# Patient Record
Sex: Male | Born: 1945 | Race: White | Hispanic: No | Marital: Married | Smoking: Never smoker
Health system: Southern US, Community
[De-identification: ages and names within clinical notes are randomized; demographics above are authoritative.]

## PROBLEM LIST (undated history)

## (undated) DIAGNOSIS — E781 Pure hyperglyceridemia: Secondary | ICD-10-CM

## (undated) DIAGNOSIS — Z87448 Personal history of other diseases of urinary system: Secondary | ICD-10-CM

## (undated) DIAGNOSIS — K219 Gastro-esophageal reflux disease without esophagitis: Secondary | ICD-10-CM

## (undated) DIAGNOSIS — K449 Diaphragmatic hernia without obstruction or gangrene: Secondary | ICD-10-CM

## (undated) DIAGNOSIS — E119 Type 2 diabetes mellitus without complications: Secondary | ICD-10-CM

## (undated) DIAGNOSIS — Z85828 Personal history of other malignant neoplasm of skin: Secondary | ICD-10-CM

## (undated) DIAGNOSIS — M1990 Unspecified osteoarthritis, unspecified site: Secondary | ICD-10-CM

## (undated) HISTORY — DX: Unspecified osteoarthritis, unspecified site: M19.90

## (undated) HISTORY — DX: Gastro-esophageal reflux disease without esophagitis: K21.9

## (undated) HISTORY — DX: Type 2 diabetes mellitus without complications: E11.9

## (undated) HISTORY — DX: Personal history of other malignant neoplasm of skin: Z85.828

## (undated) HISTORY — DX: Personal history of other diseases of urinary system: Z87.448

## (undated) HISTORY — DX: Pure hyperglyceridemia: E78.1

## (undated) HISTORY — DX: Diaphragmatic hernia without obstruction or gangrene: K44.9

---

## 1999-11-25 ENCOUNTER — Emergency Department (HOSPITAL_COMMUNITY): Admission: EM | Admit: 1999-11-25 | Discharge: 1999-11-26 | Payer: Self-pay | Admitting: *Deleted

## 2000-04-02 ENCOUNTER — Ambulatory Visit (HOSPITAL_COMMUNITY): Admission: RE | Admit: 2000-04-02 | Discharge: 2000-04-02 | Payer: Self-pay | Admitting: *Deleted

## 2000-04-02 ENCOUNTER — Encounter: Payer: Self-pay | Admitting: *Deleted

## 2004-04-16 HISTORY — PX: OTHER SURGICAL HISTORY: SHX169

## 2004-05-29 ENCOUNTER — Ambulatory Visit (HOSPITAL_BASED_OUTPATIENT_CLINIC_OR_DEPARTMENT_OTHER): Admission: RE | Admit: 2004-05-29 | Discharge: 2004-05-29 | Payer: Self-pay | Admitting: Urology

## 2005-06-10 ENCOUNTER — Ambulatory Visit: Payer: Self-pay | Admitting: Pulmonary Disease

## 2005-07-03 ENCOUNTER — Ambulatory Visit: Payer: Self-pay | Admitting: Pulmonary Disease

## 2005-09-11 ENCOUNTER — Ambulatory Visit: Payer: Self-pay | Admitting: Gastroenterology

## 2005-09-25 ENCOUNTER — Ambulatory Visit: Payer: Self-pay | Admitting: Gastroenterology

## 2007-01-04 ENCOUNTER — Ambulatory Visit: Payer: Self-pay | Admitting: Pulmonary Disease

## 2007-01-13 ENCOUNTER — Ambulatory Visit: Payer: Self-pay | Admitting: Pulmonary Disease

## 2007-01-13 LAB — CONVERTED CEMR LAB
ALT: 18 units/L (ref 0–53)
AST: 20 units/L (ref 0–37)
Albumin: 4.1 g/dL (ref 3.5–5.2)
Alkaline Phosphatase: 66 units/L (ref 39–117)
BUN: 16 mg/dL (ref 6–23)
Basophils Absolute: 0.1 10*3/uL (ref 0.0–0.1)
Basophils Relative: 0.8 % (ref 0.0–1.0)
Bilirubin Urine: NEGATIVE
Bilirubin, Direct: 0.1 mg/dL (ref 0.0–0.3)
CO2: 32 meq/L (ref 19–32)
Calcium: 10.2 mg/dL (ref 8.4–10.5)
Chloride: 110 meq/L (ref 96–112)
Cholesterol: 187 mg/dL (ref 0–200)
Creatinine, Ser: 1.1 mg/dL (ref 0.4–1.5)
Direct LDL: 87.8 mg/dL
Eosinophils Absolute: 0.1 10*3/uL (ref 0.0–0.6)
Eosinophils Relative: 1.6 % (ref 0.0–5.0)
GFR calc Af Amer: 88 mL/min
GFR calc non Af Amer: 73 mL/min
Glucose, Bld: 92 mg/dL (ref 70–99)
HCT: 43 % (ref 39.0–52.0)
HDL: 33.2 mg/dL — ABNORMAL LOW (ref >39.0)
Hemoglobin: 15.1 g/dL (ref 13.0–17.0)
Ketones, ur: NEGATIVE mg/dL
Leukocytes, UA: NEGATIVE
Lymphocytes Relative: 17.1 % (ref 12.0–46.0)
MCHC: 35 g/dL (ref 30.0–36.0)
MCV: 86.8 fL (ref 78.0–100.0)
Monocytes Absolute: 0.5 10*3/uL (ref 0.2–0.7)
Monocytes Relative: 6.2 % (ref 3.0–11.0)
Neutro Abs: 6.3 10*3/uL (ref 1.4–7.7)
Neutrophils Relative %: 74.3 % (ref 43.0–77.0)
Nitrite: NEGATIVE
PSA: 0.9 ng/mL (ref 0.10–4.00)
Platelets: 165 10*3/uL (ref 150–400)
Potassium: 4.5 meq/L (ref 3.5–5.1)
RBC: 4.95 M/uL (ref 4.22–5.81)
RDW: 11.6 % (ref 11.5–14.6)
Sodium: 147 meq/L — ABNORMAL HIGH (ref 135–145)
Specific Gravity, Urine: 1.025 (ref 1.000–1.03)
TSH: 1.05 microintl units/mL (ref 0.35–5.50)
Total Bilirubin: 1 mg/dL (ref 0.3–1.2)
Total CHOL/HDL Ratio: 5.6
Total Protein, Urine: NEGATIVE mg/dL
Total Protein: 7.2 g/dL (ref 6.0–8.3)
Triglycerides: 221 mg/dL (ref 0–149)
Urine Glucose: NEGATIVE mg/dL
Urobilinogen, UA: 0.2 (ref 0.0–1.0)
VLDL: 44 mg/dL — ABNORMAL HIGH (ref 0–40)
WBC: 8.4 10*3/uL (ref 4.5–10.5)
pH: 6 (ref 5.0–8.0)

## 2008-02-16 DIAGNOSIS — K219 Gastro-esophageal reflux disease without esophagitis: Secondary | ICD-10-CM

## 2008-02-16 DIAGNOSIS — K449 Diaphragmatic hernia without obstruction or gangrene: Secondary | ICD-10-CM | POA: Insufficient documentation

## 2008-02-16 DIAGNOSIS — K5730 Diverticulosis of large intestine without perforation or abscess without bleeding: Secondary | ICD-10-CM | POA: Insufficient documentation

## 2008-02-17 ENCOUNTER — Ambulatory Visit: Payer: Self-pay | Admitting: Pulmonary Disease

## 2008-02-19 DIAGNOSIS — N35919 Unspecified urethral stricture, male, unspecified site: Secondary | ICD-10-CM | POA: Insufficient documentation

## 2008-02-19 DIAGNOSIS — R0789 Other chest pain: Secondary | ICD-10-CM | POA: Insufficient documentation

## 2008-02-19 DIAGNOSIS — E782 Mixed hyperlipidemia: Secondary | ICD-10-CM | POA: Insufficient documentation

## 2008-02-19 DIAGNOSIS — E785 Hyperlipidemia, unspecified: Secondary | ICD-10-CM | POA: Insufficient documentation

## 2008-02-19 DIAGNOSIS — M1990 Unspecified osteoarthritis, unspecified site: Secondary | ICD-10-CM | POA: Insufficient documentation

## 2008-02-19 DIAGNOSIS — Z85828 Personal history of other malignant neoplasm of skin: Secondary | ICD-10-CM

## 2008-03-10 ENCOUNTER — Telehealth: Payer: Self-pay | Admitting: Pulmonary Disease

## 2008-04-03 ENCOUNTER — Telehealth (INDEPENDENT_AMBULATORY_CARE_PROVIDER_SITE_OTHER): Payer: Self-pay | Admitting: *Deleted

## 2008-06-20 ENCOUNTER — Ambulatory Visit: Payer: Self-pay | Admitting: Pulmonary Disease

## 2009-04-19 ENCOUNTER — Ambulatory Visit: Payer: Self-pay | Admitting: Pulmonary Disease

## 2010-07-14 LAB — CONVERTED CEMR LAB
ALT: 20 units/L (ref 0–53)
AST: 19 units/L (ref 0–37)
Albumin: 4.5 g/dL (ref 3.5–5.2)
Alkaline Phosphatase: 64 units/L (ref 39–117)
BUN: 16 mg/dL (ref 6–23)
Basophils Absolute: 0.1 10*3/uL (ref 0.0–0.1)
Basophils Relative: 0.9 % (ref 0.0–3.0)
Bilirubin, Direct: 0.1 mg/dL (ref 0.0–0.3)
CO2: 30 meq/L (ref 19–32)
Calcium: 9.5 mg/dL (ref 8.4–10.5)
Chloride: 104 meq/L (ref 96–112)
Cholesterol: 202 mg/dL (ref 0–200)
Creatinine, Ser: 1 mg/dL (ref 0.4–1.5)
Direct LDL: 120.8 mg/dL
Eosinophils Absolute: 0.1 10*3/uL (ref 0.0–0.7)
Eosinophils Relative: 1 % (ref 0.0–5.0)
GFR calc Af Amer: 98 mL/min
GFR calc non Af Amer: 81 mL/min
Glucose, Bld: 88 mg/dL (ref 70–99)
HCT: 43.4 % (ref 39.0–52.0)
HDL: 35.8 mg/dL — ABNORMAL LOW (ref >39.0)
Hemoglobin: 15.2 g/dL (ref 13.0–17.0)
Lymphocytes Relative: 17.8 % (ref 12.0–46.0)
MCHC: 34.9 g/dL (ref 30.0–36.0)
MCV: 89.1 fL (ref 78.0–100.0)
Monocytes Absolute: 0.4 10*3/uL (ref 0.1–1.0)
Monocytes Relative: 4.8 % (ref 3.0–12.0)
Neutro Abs: 6.6 10*3/uL (ref 1.4–7.7)
Neutrophils Relative %: 75.5 % (ref 43.0–77.0)
PSA: 0.84 ng/mL (ref 0.10–4.00)
Platelets: 189 10*3/uL (ref 150–400)
Potassium: 4.2 meq/L (ref 3.5–5.1)
RBC: 4.87 M/uL (ref 4.22–5.81)
RDW: 12.1 % (ref 11.5–14.6)
Sodium: 141 meq/L (ref 135–145)
TSH: 1.04 microintl units/mL (ref 0.35–5.50)
Total Bilirubin: 1.3 mg/dL — ABNORMAL HIGH (ref 0.3–1.2)
Total CHOL/HDL Ratio: 5.6
Total Protein: 7.8 g/dL (ref 6.0–8.3)
Triglycerides: 136 mg/dL (ref 0–149)
VLDL: 27 mg/dL (ref 0–40)
WBC: 8.7 10*3/uL (ref 4.5–10.5)

## 2010-10-29 NOTE — Assessment & Plan Note (Signed)
Jeffrey Dodson                             PULMONARY OFFICE NOTE   Jeffrey Dodson, Jeffrey Dodson                     MRN:          [id number]  DATE:01/04/2007                            DOB:          01-23-1946    HISTORY OF PRESENT ILLNESS:  Patient is a 65 year old white male patient  of Dr. Jodelle Green who has a known history of gastroesophageal reflux.  The  patient has not been seen in the office in greater than a year and a  half.  Reports that he has been doing well and is only on daily  medicines of Prilosec and aspirin.  Patient complains that over the last  3 days he has had an episode in which he feels lightheaded when he turns  his head, stands up from a sitting position, or rolls over in the bed.  Patient did take his blood pressure over the weekend and it was at  155/88.  This was taken right after patient had worked outside for over  an hour in extreme temperatures.  Patient denies any visual or speech  changes, chest pain, palpitations, difficulty swallowing, abdominal  pain, nausea, vomiting.   PAST MEDICAL HISTORY:  1. Gastroesophageal reflux.  2. Diverticulosis with colonoscopy in April 2007 by Dr. Corinda Gubler.   CURRENT MEDICATIONS:  1. Prilosec 20 mg daily.  2. Aspirin 81 mg daily.   DRUG ALLERGIES:  NO KNOWN DRUG ALLERGIES.   PHYSICAL EXAMINATION:  Patient is a pleasant male in no acute distress.  He is afebrile, blood pressure is 124/82, O2 saturation is 99% on room  air, weight is at 187.  HEENT:  PERRLA.  EOMI without nystagmus.  Nasal mucosa is pale.  Posterior pharynx is clear.  Nontender sinuses.  TMs are normal.  NECK:  Supple without cervical adenopathy.  No JVD.  Carotids are equal  with positive upstrokes.  No bruits noted.  LUNGS:  Sounds are clear to auscultation bilaterally.  CARDIAC:  S1-S2 without murmur, rub, or gallop.  ABDOMEN:  Soft and nontender, no palpable hepatosplenomegaly.  EXTREMITIES:  Warm without any calf  tenderness, cyanosis, clubbing, or  edema.  NEURO:  Alert and oriented x3.  Cranial nerves II-XII are intact.  Equal  strength of the upper and lower extremities.  Normal hand grips.  Normal  gait.  Negative Romberg.  Head maneuvers without reproducible symptoms  and negative nystagmus.   IMPRESSION AND PLAN:  1. Dizziness, suspect underlying vertigo.  Patient is recommended to      change positions slowly.  May use meclizine 25 mg a half to a whole      tablet up to 3 times a day as needed.  Patient is to return back      here in 2 weeks as scheduled for his complete physical exam or      sooner if needed.  Patient is advised if symptoms do not improve or      worsen he is to contact the office for sooner followup.  2. Elevated blood pressure readings.  I would recommend that patient      check  blood pressures at rest each day and bring in a blood      pressure log into his next office visit.  He is to avoid over the      counter products with decongestants.  Patient will follow back up      as scheduled or sooner if needed.      Rubye Oaks, NP  Electronically Signed      Lonzo Cloud. Kriste Basque, MD  Electronically Signed   TP/MedQ  DD: 01/05/2007  DT: 01/05/2007  Job #: [id number]

## 2010-11-01 NOTE — Op Note (Signed)
NAME:  Jeffrey Dodson, Jeffrey Dodson              ACCOUNT NO.:  [id number]   MEDICAL RECORD NO.:  [id number]          PATIENT TYPE:  AMB   LOCATION:  [facility]                         FACILITY:  [facility]   PHYSICIAN:  Ronald L. Ovidio Hanger, M.D.DATE OF BIRTH:  Mar 30, 1946   DATE OF PROCEDURE:  05/29/2004  DATE OF DISCHARGE:                                 OPERATIVE REPORT   PREOPERATIVE DIAGNOSIS:  Urethral stricture disease.   OPERATION/PROCEDURE:  1.  Cystourethroscopy.  2.  Optical urethrotomy.   SURGEON:  Lucrezia Starch. Earlene Plater, M.D.   ANESTHESIA:  LMA.   ESTIMATED BLOOD LOSS:  Negligible.   DRAINS:  20-French coude Foley catheter.   COMPLICATIONS:  None.   INDICATIONS FOR PROCEDURE:  [name] is a very nice 65 year old white  male who presented with pelvic pain, hesitant voiding.  He underwent a CT  scan of the pelvis which was normal but on cystourethroscopy, was found to  have a significant urethral stricture in the deep bulbar area with  essentially pinhole.  He understands the risks, benefits and alternatives.  He elected to proceed with the above procedure.   DESCRIPTION OF PROCEDURE:  The patient was placed in the supine position.  After the proper LMA anesthesia, was placed in the dorsal lithotomy  position, prepped and draped with Betadine in the sterile fashion.  Cystourethroscopy was performed after the meatus had been dilated to 30-  [place] with [employer] sounds by utilizing the optical urethrotome and the 0-  degree lens.  The stricture was identified and a 3-French open-ended  catheter was placed through it.  It was deep bulbar and was approximately 1  cm in length.  It was somewhat filamentous but very tight.  Utilizing the  optical urethrotome in the 12 o'clock position, it was incised to normal  vascularized tissue and widely opened distal to the sphincter.  Cystourethroscopy was then performed.  The bladder really was smooth-walled,  effluxing clear urine through normally-placed  ureteral orifices bilaterally.  Both 12-degree and 70-degree lenses were utilized to look at the bladder.  There was moderate trilobar hypertrophy and again reinspection revealed that  the urethral stricture was widely patent.  After the urethrotome had been  visually removed, a 20-French, 10 mL balloon coude catheter was passed.  The  bladder was draining clear.  It was maintained and the patient was taken to  the recovery room stable.     Rona  RLD/MEDQ  D:  05/29/2004  T:  05/29/2004  Job:  [id number]

## 2010-11-01 NOTE — Cardiovascular Report (Signed)
[facility]  Patient:    HANI, CAMPUSANO                     MRN: [id number] Proc. Date: 04/02/00 Adm. Date:  [id number] Attending:  Darlin Priestly CC:         Kerry Kass, M.D. [facility]  Cardiac Cath Lab   Cardiac Catheterization  PROCEDURES: 1. Left heart catheterization. 2. Coronary angiography. 3. Left ventriculogram.  ATTENDING FOR THE CASE:  Lenise Herald, M.D.  COMPLICATIONS:  None.  INDICATIONS:  Mr. Katich is a 65 year old white male patient of Dr. Jethro Bolus, with history of intermittent chest pain for several months.  The patient has undergone a recent stress Cardiolite which revealed evidence of inferior wall scar with no evidence of reversible ischemia; however, he continued to have chest pain and has elected to proceed with cardiac catheterization to definitely define his coronary anatomy.  DESCRIPTION OF PROCEDURE:  After obtaining full and informed consent, the patient was brought to the cardiac catheterization lab where his right and left groins were shaved, prepped, and draped in the usual sterile fashion. ECG monitoring was established.  Using modified Seldinger technique, #6 French arterial sheath inserted in the right femoral artery.  The 6 French diagnostic catheter was then used to perform diagnostic angiography.  This reveals a large left vein with no significant disease.  The LAD is a large vessel which coursed the apex and gave rise to diagonal branches.  The LAD has no significant disease.  The first and second diagonal branches were medium sized vessels with no significant disease.  Left circumflex was a large vessel which coursed in the A-V groove and gave rise to three obtuse marginals.  The A-V groove and circumflex had no significant disease.  The first OMs and small vessel had no significant disease.  The second OM was a large vessel which bifurcates in its mid portion and has no significant  disease.  The third OM is a small vessel with no significant disease.  The right coronary artery was a large vessel, which was dominant ______both PDA, as well as  posterolateral branch.  There was no significant disease the RCA, PDA, or posterolateral branch .  LEFT VENTRICULOGRAM:  The left ventriculogram revealed a preserved EF 50%.  HEMODYNAMICS:  Systemic arterial pressure 122/76, LV systemic pressure 122/10, LVEDP of 16.  The right femoral site was then closed using a Perclose device without complications.  Ancef 1 gram was given prophylactically.  CONCLUSIONS: 1. No significant coronary artery disease. 2. Normal left ventricular systolic function. DD:  04/02/00 TD:  04/02/00 Job: [id number]

## 2013-12-12 ENCOUNTER — Encounter: Payer: Self-pay | Admitting: *Deleted

## 2013-12-12 ENCOUNTER — Ambulatory Visit (INDEPENDENT_AMBULATORY_CARE_PROVIDER_SITE_OTHER): Payer: Medicare Other | Admitting: Physician Assistant

## 2013-12-12 VITALS — BP 125/71 | HR 52 | Temp 98.1°F | Resp 16 | Ht 70.0 in | Wt 190.5 lb

## 2013-12-12 DIAGNOSIS — R079 Chest pain, unspecified: Secondary | ICD-10-CM

## 2013-12-12 DIAGNOSIS — R0789 Other chest pain: Secondary | ICD-10-CM

## 2013-12-12 NOTE — Patient Instructions (Signed)
Take daily 81 mg Aspirin and Zantac daily.  Watch your salt intake.  You will be contacted by Cardiology for further evaluation to rule out a cardiac cause of your symptoms.  I do believe acid reflux is playing a part in your symptoms.  Please take Zantac daily. Avoid trigger foods and late-night eatings.  Water is better than sodas. Follow-up with me in 1-2 weeks. I recommend we do you complete physical at that time.  If chest pain recurs before workup is complete, please return to the ER.

## 2013-12-12 NOTE — Progress Notes (Signed)
Patient presents to clinic today to establish care.  Patient was admitted for overnight observation in a Hospital in [place] recently due to complaints of chest pain.  Patient has workup including CXR, troponin, EKG and Myoview study.  Labs and CXR unremarkable.  Myoview and EKG are unavailable at time of visit.  Patient endorses they were normal.    Patient describes chest pain as follows: burning in nature with some associated chest tightness.  Denies anxiety or palpitations.  Endorses that symptoms started after a week at the beach.  Endorses eating poorly. Denies recurrence of symptoms.   Past Medical History  Diagnosis Date  . Other chest pain   . Hypercholesterolemia   . Hiatal hernia   . GERD (gastroesophageal reflux disease)   . Diverticulitis of colon   . Hx of urethral stricture   . Degenerative joint disease   . Personal history of skin cancer     Past Surgical History  Procedure Laterality Date  . S/p urethrotomy  11/05    Dr Rosana Hoes    No current outpatient prescriptions on file prior to visit.   No current facility-administered medications on file prior to visit.    No Known Allergies  Family History  Problem Relation Age of Onset  . Deep vein thrombosis Father 73    Deceased  . Other Father     PTE  . Hypertension Mother   . Congestive Heart Failure    . Diabetes Mellitus II    . Cirrhosis Brother     Liver, deceased  . Stroke Sister   . Diabetes Sister   . COPD Sister     History   Social History  . Marital Status: Married    Spouse Name: N/A    Number of Children: N/A  . Years of Education: N/A   Occupational History  . Not on file.   Social History Main Topics  . Smoking status: Never Smoker   . Smokeless tobacco: Not on file  . Alcohol Use: Not on file  . Drug Use: Not on file  . Sexual Activity: Not on file   Other Topics Concern  . Not on file   Social History Narrative   Married to [facility] [employer] [facility]   Non-smoker   No ETOH         ROS See HPI.  All other ROS are negative.  BP 125/71  Pulse 52  Temp(Src) 98.1 F (36.7 C) (Oral)  Resp 16  Ht 5\' 10"  (1.778 m)  Wt 190 lb 8 oz (86.41 kg)  BMI 27.33 kg/m2  SpO2 98%  Physical Exam  Vitals reviewed. Constitutional: He is oriented to person, place, and time and well-developed, well-nourished, and in no distress.  HENT:  Head: Normocephalic and atraumatic.  Eyes: Conjunctivae are normal. Pupils are equal, round, and reactive to light.  Neck: Neck supple. No thyromegaly present.  Cardiovascular: Normal rate, regular rhythm, normal heart sounds and intact distal pulses.   Pulmonary/Chest: Effort normal and breath sounds normal. No respiratory distress. He has no wheezes. He has no rales. He exhibits no tenderness.  Abdominal: Soft. Bowel sounds are normal. He exhibits no distension and no mass. There is no tenderness. There is no rebound and no guarding.  Lymphadenopathy:    He has no cervical adenopathy.  Neurological: He is alert and oriented to person, place, and time.  Skin: Skin is warm and dry. No rash noted.  Psychiatric: Affect normal.   Assessment/Plan:  Atypical chest pain Suspect GI, likely GERD related.  EKG obtained revealing sinus bradycardia..  Will have cardiac workup faxed from hospital.  Will obtain labs today.  Referral placed to Cardiology to further r/o cardiac etiology. Encourage daily aspirin. Rx Ranitidine up to BID.  Avoid late-night eating.  Avoid trigger foods.

## 2013-12-12 NOTE — Progress Notes (Signed)
Pre visit review using our clinic review tool, if applicable. No additional management support is needed unless otherwise documented below in the visit note/SLS  

## 2013-12-13 LAB — COMPREHENSIVE METABOLIC PANEL
ALBUMIN: 4.7 g/dL (ref 3.5–5.2)
ALT: 36 U/L (ref 0–53)
AST: 34 U/L (ref 0–37)
Alkaline Phosphatase: 63 U/L (ref 39–117)
BUN: 17 mg/dL (ref 6–23)
CALCIUM: 9.8 mg/dL (ref 8.4–10.5)
CHLORIDE: 104 meq/L (ref 96–112)
CO2: 27 meq/L (ref 19–32)
Creatinine, Ser: 1.2 mg/dL (ref 0.4–1.5)
GFR: 67.65 mL/min (ref >60.00)
GLUCOSE: 47 mg/dL — AB (ref 70–99)
POTASSIUM: 4.5 meq/L (ref 3.5–5.1)
SODIUM: 141 meq/L (ref 135–145)
TOTAL PROTEIN: 7.9 g/dL (ref 6.0–8.3)
Total Bilirubin: 0.9 mg/dL (ref 0.2–1.2)

## 2013-12-13 LAB — LIPID PANEL
CHOLESTEROL: 176 mg/dL (ref 0–200)
HDL: 41 mg/dL (ref >39.00)
LDL Cholesterol: 90 mg/dL (ref 0–99)
NonHDL: 135
TRIGLYCERIDES: 224 mg/dL — AB (ref 0.0–149.0)
Total CHOL/HDL Ratio: 4
VLDL: 44.8 mg/dL — ABNORMAL HIGH (ref 0.0–40.0)

## 2013-12-14 ENCOUNTER — Encounter (HOSPITAL_COMMUNITY): Payer: Self-pay | Admitting: Emergency Medicine

## 2013-12-14 ENCOUNTER — Encounter: Payer: Self-pay | Admitting: Physician Assistant

## 2013-12-14 ENCOUNTER — Emergency Department (HOSPITAL_COMMUNITY)
Admission: EM | Admit: 2013-12-14 | Discharge: 2013-12-15 | Disposition: A | Discharge status: Home / Self Care | Payer: Medicare Other | Attending: Emergency Medicine | Admitting: Emergency Medicine

## 2013-12-14 ENCOUNTER — Ambulatory Visit (INDEPENDENT_AMBULATORY_CARE_PROVIDER_SITE_OTHER): Payer: Medicare Other | Admitting: Physician Assistant

## 2013-12-14 VITALS — BP 118/84 | HR 45 | Temp 97.8°F | Resp 16 | Ht 70.0 in | Wt 189.2 lb

## 2013-12-14 DIAGNOSIS — Z79899 Other long term (current) drug therapy: Secondary | ICD-10-CM | POA: Insufficient documentation

## 2013-12-14 DIAGNOSIS — R5383 Other fatigue: Secondary | ICD-10-CM

## 2013-12-14 DIAGNOSIS — E7800 Pure hypercholesterolemia, unspecified: Secondary | ICD-10-CM | POA: Insufficient documentation

## 2013-12-14 DIAGNOSIS — R7309 Other abnormal glucose: Secondary | ICD-10-CM

## 2013-12-14 DIAGNOSIS — R5381 Other malaise: Secondary | ICD-10-CM

## 2013-12-14 DIAGNOSIS — K5732 Diverticulitis of large intestine without perforation or abscess without bleeding: Secondary | ICD-10-CM | POA: Insufficient documentation

## 2013-12-14 DIAGNOSIS — K219 Gastro-esophageal reflux disease without esophagitis: Secondary | ICD-10-CM | POA: Insufficient documentation

## 2013-12-14 DIAGNOSIS — R079 Chest pain, unspecified: Secondary | ICD-10-CM | POA: Insufficient documentation

## 2013-12-14 DIAGNOSIS — Z87448 Personal history of other diseases of urinary system: Secondary | ICD-10-CM | POA: Insufficient documentation

## 2013-12-14 DIAGNOSIS — K449 Diaphragmatic hernia without obstruction or gangrene: Secondary | ICD-10-CM | POA: Insufficient documentation

## 2013-12-14 DIAGNOSIS — M1990 Unspecified osteoarthritis, unspecified site: Secondary | ICD-10-CM | POA: Insufficient documentation

## 2013-12-14 DIAGNOSIS — Z7982 Long term (current) use of aspirin: Secondary | ICD-10-CM | POA: Insufficient documentation

## 2013-12-14 DIAGNOSIS — Z85828 Personal history of other malignant neoplasm of skin: Secondary | ICD-10-CM | POA: Insufficient documentation

## 2013-12-14 LAB — BASIC METABOLIC PANEL
BUN: 17 mg/dL (ref 6–23)
CHLORIDE: 106 meq/L (ref 96–112)
CO2: 26 mEq/L (ref 19–32)
CREATININE: 1.18 mg/dL (ref 0.50–1.35)
Calcium: 9.7 mg/dL (ref 8.4–10.5)
Glucose, Bld: 78 mg/dL (ref 70–99)
POTASSIUM: 4.5 meq/L (ref 3.5–5.3)
Sodium: 139 mEq/L (ref 135–145)

## 2013-12-14 LAB — CBC
HEMATOCRIT: 41.5 % (ref 39.0–52.0)
Hemoglobin: 14.8 g/dL (ref 13.0–17.0)
MCH: 30.1 pg (ref 26.0–34.0)
MCHC: 35.7 g/dL (ref 30.0–36.0)
MCV: 84.5 fL (ref 78.0–100.0)
PLATELETS: 165 10*3/uL (ref 150–400)
RBC: 4.91 MIL/uL (ref 4.22–5.81)
RDW: 13.3 % (ref 11.5–15.5)
WBC: 8 10*3/uL (ref 4.0–10.5)

## 2013-12-14 LAB — TSH: TSH: 1.124 u[IU]/mL (ref 0.350–4.500)

## 2013-12-14 LAB — GLUCOSE, POCT (MANUAL RESULT ENTRY): POC GLUCOSE: 75 mg/dL (ref 70–99)

## 2013-12-14 MED ORDER — ASPIRIN 81 MG PO CHEW
162.0000 mg | CHEWABLE_TABLET | Freq: Once | ORAL | Status: AC
  Administered 2013-12-15: 162 mg via ORAL
  Filled 2013-12-14: qty 2

## 2013-12-14 NOTE — Progress Notes (Signed)
Pre visit review using our clinic review tool, if applicable. No additional management support is needed unless otherwise documented below in the visit note/SLS  

## 2013-12-14 NOTE — Assessment & Plan Note (Signed)
Fasting POC glucose at 75.  Will obtain BMP.  Encouraged more frequent meals.  Patient is not getting enough to eat daily.

## 2013-12-14 NOTE — Progress Notes (Signed)
Patient presents to clinic today for blood glucose recheck after a critical glucose level was obtained after last visit.  Glucose level on BMP found to be at 47. Patient was contacted and assessed for symptoms of hypoglycemia.  Patient without symptoms.  In clinic today, patient has been fasting.  Fingerstick glucose at 75.  Patient does wish to have some labs to assess for his generalized fatigue.  Denies chest pain, SOB, palpitations, LH or dizziness.  Denies hx of anemia or vitamin deficiency.  Past Medical History  Diagnosis Date  . Other chest pain   . Hypercholesterolemia   . Hiatal hernia   . GERD (gastroesophageal reflux disease)   . Diverticulitis of colon   . Hx of urethral stricture   . Degenerative joint disease   . Personal history of skin cancer     Current Outpatient Prescriptions on File Prior to Visit  Medication Sig Dispense Refill  . aspirin 81 MG tablet Take 81 mg by mouth daily.      Marland Kitchen ibuprofen (ADVIL,MOTRIN) 200 MG tablet Take 200 mg by mouth daily as needed.      . ranitidine (ZANTAC) 150 MG capsule Take 150 mg by mouth daily as needed for heartburn.       No current facility-administered medications on file prior to visit.    No Known Allergies  Family History  Problem Relation Age of Onset  . Deep vein thrombosis Father 19    Deceased  . Other Father     PTE  . Hypertension Mother   . Congestive Heart Failure    . Diabetes Mellitus II    . Cirrhosis Brother     Liver, deceased  . Stroke Sister   . Diabetes Sister   . COPD Sister     History   Social History  . Marital Status: Married    Spouse Name: N/A    Number of Children: N/A  . Years of Education: N/A   Social History Main Topics  . Smoking status: Never Smoker   . Smokeless tobacco: None  . Alcohol Use: None  . Drug Use: None  . Sexual Activity: None   Other Topics Concern  . None   Social History Narrative   Married to [employer]   Non-smoker   No ETOH          Review of Systems - See HPI.  All other ROS are negative.  BP 118/84  Pulse 45  Temp(Src) 97.8 F (36.6 C) (Oral)  Resp 16  Ht 5\' 10"  (1.778 m)  Wt 189 lb 4 oz (85.843 kg)  BMI 27.15 kg/m2  SpO2 98%  Physical Exam  Constitutional: He is oriented to person, place, and time and well-developed, well-nourished, and in no distress.  HENT:  Head: Normocephalic and atraumatic.  Eyes: Conjunctivae are normal.  Cardiovascular: Regular rhythm, normal heart sounds and intact distal pulses.   No murmur heard. Bradycardic at 52.  Chronic for patient.  Pulmonary/Chest: Effort normal and breath sounds normal.  Neurological: He is alert and oriented to person, place, and time.  Skin: Skin is warm and dry. No rash noted.  Psychiatric: Affect normal.    Recent Results (from the past 2160 hour(s))  COMPREHENSIVE METABOLIC PANEL     Status: Abnormal   Collection Time    12/12/13 11:43 AM      Result Value Ref Range   Sodium 141  135 - 145 mEq/L   Potassium 4.5  3.5 -  5.1 mEq/L   Chloride 104  96 - 112 mEq/L   CO2 27  19 - 32 mEq/L   Glucose, Bld 47 (*) 70 - 99 mg/dL   BUN 17  6 - 23 mg/dL   Creatinine, Ser 1.2  0.4 - 1.5 mg/dL   Total Bilirubin 0.9  0.2 - 1.2 mg/dL   Alkaline Phosphatase 63  39 - 117 U/L   AST 34  0 - 37 U/L   ALT 36  0 - 53 U/L   Total Protein 7.9  6.0 - 8.3 g/dL   Albumin 4.7  3.5 - 5.2 g/dL   Calcium 9.8  8.4 - 10.5 mg/dL   GFR 67.65  >60.00 mL/min  LIPID PANEL     Status: Abnormal   Collection Time    12/12/13 11:43 AM      Result Value Ref Range   Cholesterol 176  0 - 200 mg/dL   Comment: ATP III Classification       Desirable:  < 200 mg/dL               Borderline High:  200 - 239 mg/dL          High:  > = 240 mg/dL   Triglycerides 224.0 (*) 0.0 - 149.0 mg/dL   Comment: Normal:  <150 mg/dLBorderline High:  150 - 199 mg/dL   HDL 41.00  >39.00 mg/dL   VLDL 44.8 (*) 0.0 - 40.0 mg/dL   LDL Cholesterol 90  0 - 99 mg/dL   Total CHOL/HDL Ratio 4      Comment:                Men          Women1/2 Average Risk     3.4          3.3Average Risk          5.0          4.42X Average Risk          9.6          7.13X Average Risk          15.0          11.0                       NonHDL 135.00    GLUCOSE, POCT (MANUAL RESULT ENTRY)     Status: None   Collection Time    12/14/13 11:41 AM      Result Value Ref Range   POC Glucose 75  70 - 99 mg/dl   Comment: Fasting    Assessment/Plan: Other fatigue Will begin by checking CBC, TSH, Vitamin D level.  Recent BMP revealed normal electrolytes.  Abnormal blood sugar Fasting POC glucose at 75.  Will obtain BMP.  Encouraged more frequent meals.  Patient is not getting enough to eat daily.

## 2013-12-14 NOTE — ED Notes (Signed)
PT placed in gown and in bed. PT monitored by 5-lead, bp cuff, and pulse ox.

## 2013-12-14 NOTE — Assessment & Plan Note (Signed)
Will begin by checking CBC, TSH, Vitamin D level.  Recent BMP revealed normal electrolytes.

## 2013-12-14 NOTE — ED Notes (Signed)
Patient with chest pain that started last week.  Patient states that the pain stopped, but started again this evening at 2130. Patient states that he has numbness across his shoulder.  Patient states he does have some shortness of breath, no nausea or vomiting.  Patient is CAOx3 in triage.

## 2013-12-14 NOTE — Patient Instructions (Signed)
Please obtain labs.  I will call you with your results.  Please start taking a daily krill oil supplement.  Increase intake of fish and lean protein.  Make sure you are eating something every 3 hours.  Stay well hydrated. Continue medications as directed.  Follow-up will be based on lab results.

## 2013-12-15 ENCOUNTER — Emergency Department (HOSPITAL_COMMUNITY): Payer: Medicare Other

## 2013-12-15 LAB — BASIC METABOLIC PANEL
Anion gap: 14 (ref 5–15)
BUN: 17 mg/dL (ref 6–23)
CHLORIDE: 100 meq/L (ref 96–112)
CO2: 25 mEq/L (ref 19–32)
Calcium: 9.4 mg/dL (ref 8.4–10.5)
Creatinine, Ser: 1.11 mg/dL (ref 0.50–1.35)
GFR calc Af Amer: 79 mL/min — ABNORMAL LOW (ref >90)
GFR calc non Af Amer: 68 mL/min — ABNORMAL LOW (ref >90)
GLUCOSE: 134 mg/dL — AB (ref 70–99)
POTASSIUM: 4.1 meq/L (ref 3.7–5.3)
SODIUM: 139 meq/L (ref 137–147)

## 2013-12-15 LAB — TROPONIN I: Troponin I: <0.3 ng/mL (ref <0.30)

## 2013-12-15 LAB — CBC WITH DIFFERENTIAL/PLATELET
Basophils Absolute: 0.1 10*3/uL (ref 0.0–0.1)
Basophils Relative: 2 % — ABNORMAL HIGH (ref 0–1)
Eosinophils Absolute: 0.1 10*3/uL (ref 0.0–0.7)
Eosinophils Relative: 2 % (ref 0–5)
HCT: 40 % (ref 39.0–52.0)
HEMOGLOBIN: 13.6 g/dL (ref 13.0–17.0)
LYMPHS ABS: 1.9 10*3/uL (ref 0.7–4.0)
Lymphocytes Relative: 22 % (ref 12–46)
MCH: 29.8 pg (ref 26.0–34.0)
MCHC: 34 g/dL (ref 30.0–36.0)
MCV: 87.5 fL (ref 78.0–100.0)
Monocytes Absolute: 0.5 10*3/uL (ref 0.1–1.0)
Monocytes Relative: 6 % (ref 3–12)
NEUTROS ABS: 5.6 10*3/uL (ref 1.7–7.7)
NEUTROS PCT: 68 % (ref 43–77)
Platelets: 148 10*3/uL — ABNORMAL LOW (ref 150–400)
RBC: 4.57 MIL/uL (ref 4.22–5.81)
RDW: 12.5 % (ref 11.5–15.5)
WBC: 8.3 10*3/uL (ref 4.0–10.5)

## 2013-12-15 LAB — D-DIMER, QUANTITATIVE (NOT AT ARMC)

## 2013-12-15 MED ORDER — GI COCKTAIL ~~LOC~~
30.0000 mL | Freq: Once | ORAL | Status: AC
  Administered 2013-12-15: 30 mL via ORAL
  Filled 2013-12-15: qty 30

## 2013-12-15 NOTE — Discharge Instructions (Signed)
Chest Pain (Nonspecific) It is often hard to give a specific diagnosis for the cause of chest pain. There is always a chance that your pain could be related to something serious, such as a heart attack or a blood clot in the lungs. You need to follow up with your health care provider for further evaluation.  Your workup in the emergency department is reassuring. Given recent admission for chest pain rule out and stress test, so you're safe for discharge home. You need to followup very closely with cardiology in the next 1-2 days. He should also followup with her primary physician. If you have any new or worsening symptoms she should return immediately. CAUSES   Heartburn.  Pneumonia or bronchitis.  Anxiety or stress.  Inflammation around your heart (pericarditis) or lung (pleuritis or pleurisy).  A blood clot in the lung.  A collapsed lung (pneumothorax). It can develop suddenly on its own (spontaneous pneumothorax) or from trauma to the chest.  Shingles infection (herpes zoster virus). The chest wall is composed of bones, muscles, and cartilage. Any of these can be the source of the pain.  The bones can be bruised by injury.  The muscles or cartilage can be strained by coughing or overwork.  The cartilage can be affected by inflammation and become sore (costochondritis). DIAGNOSIS  Lab tests or other studies may be needed to find the cause of your pain. Your health care provider may have you take a test called an ambulatory electrocardiogram (ECG). An ECG records your heartbeat patterns over a 24-hour period. You may also have other tests, such as:  Transthoracic echocardiogram (TTE). During echocardiography, sound waves are used to evaluate how blood flows through your heart.  Transesophageal echocardiogram (TEE).  Cardiac monitoring. This allows your health care provider to monitor your heart rate and rhythm in real time.  Holter monitor. This is a portable device that records  your heartbeat and can help diagnose heart arrhythmias. It allows your health care provider to track your heart activity for several days, if needed.  Stress tests by exercise or by giving medicine that makes the heart beat faster. TREATMENT   Treatment depends on what may be causing your chest pain. Treatment may include:  Acid blockers for heartburn.  Anti-inflammatory medicine.  Pain medicine for inflammatory conditions.  Antibiotics if an infection is present.  You may be advised to change lifestyle habits. This includes stopping smoking and avoiding alcohol, caffeine, and chocolate.  You may be advised to keep your head raised (elevated) when sleeping. This reduces the chance of acid going backward from your stomach into your esophagus. Most of the time, nonspecific chest pain will improve within 2-3 days with rest and mild pain medicine.  HOME CARE INSTRUCTIONS   If antibiotics were prescribed, take them as directed. Finish them even if you start to feel better.  For the next few days, avoid physical activities that bring on chest pain. Continue physical activities as directed.  Do not use any tobacco products, including cigarettes, chewing tobacco, or electronic cigarettes.  Avoid drinking alcohol.  Only take medicine as directed by your health care provider.  Follow your health care provider's suggestions for further testing if your chest pain does not go away.  Keep any follow-up appointments you made. If you do not go to an appointment, you could develop lasting (chronic) problems with pain. If there is any problem keeping an appointment, call to reschedule. SEEK MEDICAL CARE IF:   Your chest pain does not  go away, even after treatment.  You have a rash with blisters on your chest.  You have a fever. SEEK IMMEDIATE MEDICAL CARE IF:   You have increased chest pain or pain that spreads to your arm, neck, jaw, back, or abdomen.  You have shortness of breath.  You  have an increasing cough, or you cough up blood.  You have severe back or abdominal pain.  You feel nauseous or vomit.  You have severe weakness.  You faint.  You have chills. This is an emergency. Do not wait to see if the pain will go away. Get medical help at once. Call your local emergency services (911 in [place] Do not drive yourself to the hospital. MAKE SURE YOU:   Understand these instructions.  Will watch your condition.  Will get help right away if you are not doing well or get worse. Document Released: 03/12/2005 Document Revised: 06/07/2013 Document Reviewed: 01/06/2008 [facility] Patient Information 2015 [place], [place]. This information is not intended to replace advice given to you by your health care provider. Make sure you discuss any questions you have with your health care provider.

## 2013-12-15 NOTE — ED Provider Notes (Signed)
CSN: [id number]     Arrival date & time 12/14/13  2320 History   First MD Initiated Contact with Patient 12/14/13 2334     Chief Complaint  Patient presents with  . Chest Pain     (Consider location/radiation/quality/duration/timing/severity/associated sxs/prior Treatment) HPI  This is a 68 year old male who presents with chest pain. Patient reports that he has been having chest pain since last Thursday. He was [place] home from the beach at that time and was having left-sided chest pain that radiated into his left arm. He was seen at a hospital in [place], [place].  He reports that he was admitted and had a workup that included EKG, cardiac enzymes, and a stress test which was reportedly negative. He has since followed up with [facility] on Monday and today.  Basic labwork was obtained and patient was told that he had low blood sugar. Repeat testing was done today. He is being set up to see cardiology. Patient states that he has a history of GERD and hiatal hernia. Patient states that at 9:00 tonight he had onset of burning chest pain across his mid chest. He also describes "numbness across the chest that radiated up into his jaws." Patient took several doses of Maalox and to aspirin. Currently patient is pain-free. He denies any shortness of breath or diaphoresis at this time. Pain is not worsened with exertion. Patient does have a history of hypercholesterolemia.  Otherwise he's never been a smoker and has no early family history of heart disease.  Past Medical History  Diagnosis Date  . Other chest pain   . Hypercholesterolemia   . Hiatal hernia   . GERD (gastroesophageal reflux disease)   . Diverticulitis of colon   . Hx of urethral stricture   . Degenerative joint disease   . Personal history of skin cancer    Past Surgical History  Procedure Laterality Date  . S/p urethrotomy  11/05    Dr Rosana Hoes   Family History  Problem Relation Age of Onset  . Deep vein  thrombosis Father 68    Deceased  . Other Father     PTE  . Hypertension Mother   . Congestive Heart Failure    . Diabetes Mellitus II    . Cirrhosis Brother     Liver, deceased  . Stroke Sister   . Diabetes Sister   . COPD Sister    History  Substance Use Topics  . Smoking status: Never Smoker   . Smokeless tobacco: Not on file  . Alcohol Use: Not on file    Review of Systems  Constitutional: Negative.  Negative for fever.  Respiratory: Positive for chest tightness. Negative for shortness of breath.   Cardiovascular: Negative.  Negative for chest pain.  Gastrointestinal: Negative.  Negative for nausea, vomiting and abdominal pain.  Genitourinary: Negative.  Negative for dysuria.  Skin: Negative for rash.  Neurological: Negative for headaches.  All other systems reviewed and are negative.     Allergies  Review of patient's allergies indicates no known allergies.  Home Medications   Prior to Admission medications   Medication Sig Start Date End Date Taking? Authorizing Provider  aspirin 81 MG tablet Take 81 mg by mouth daily.   Yes Historical Provider, MD  ibuprofen (ADVIL,MOTRIN) 200 MG tablet Take 200 mg by mouth daily as needed for mild pain.    Yes Historical Provider, MD  ranitidine (ZANTAC) 150 MG capsule Take 150 mg by mouth daily as needed for  heartburn.   Yes Historical Provider, MD   BP 127/75  Pulse 42  Temp(Src) 97.7 F (36.5 C) (Oral)  Resp 16  SpO2 96% Physical Exam  Nursing note and vitals reviewed. Constitutional: He is oriented to person, place, and time. He appears well-developed and well-nourished. No distress.  HENT:  Head: Normocephalic and atraumatic.  Eyes: Pupils are equal, round, and reactive to light.  Cardiovascular: Normal rate, regular rhythm and normal heart sounds.   No murmur heard. Pulmonary/Chest: Effort normal and breath sounds normal. No respiratory distress. He has no wheezes.  Abdominal: Soft. Bowel sounds are normal.  There is no tenderness. There is no rebound.  Musculoskeletal: He exhibits no edema.  Lymphadenopathy:    He has no cervical adenopathy.  Neurological: He is alert and oriented to person, place, and time.  Skin: Skin is warm and dry.  Psychiatric: He has a normal mood and affect.    ED Course  Procedures (including critical care time) Labs Review Labs Reviewed  CBC WITH DIFFERENTIAL - Abnormal; Notable for the following:    Platelets 148 (*)    Basophils Relative 2 (*)    All other components within normal limits  BASIC METABOLIC PANEL - Abnormal; Notable for the following:    Glucose, Bld 134 (*)    GFR calc non Af Amer 68 (*)    GFR calc Af Amer 79 (*)    All other components within normal limits  TROPONIN I  D-DIMER, QUANTITATIVE  TROPONIN I    Imaging Review Dg Chest 2 View  12/15/2013   CLINICAL DATA:  Right arm pain. Shortness of breath and chest discomfort  EXAM: CHEST  2 VIEW  COMPARISON:  None.  FINDINGS: The heart size and mediastinal contours are within normal limits. Both lungs are clear. The visualized skeletal structures are unremarkable.  IMPRESSION: No active cardiopulmonary disease.   Electronically Signed   By: Kerby Moors M.D.   On: 12/15/2013 00:57     EKG Interpretation   Date/Time:  Wednesday December 14 2013 23:23:12 EDT Ventricular Rate:  52 PR Interval:  148 QRS Duration: 94 QT Interval:  432 QTC Calculation: 401 R Axis:   11 Text Interpretation:  Sinus bradycardia Nonspecific ST and T wave  abnormality Abnormal ECG New T wave inversions laterally Confirmed by  Kaslyn Richburg  MD, Loma Sousa ([id number]) on 12/14/2013 11:31:14 PM      MDM   Final diagnoses:  Chest pain, unspecified chest pain type    Patient presents with chest pain. He is otherwise nontoxic-appearing and currently chest pain-free. Patient's story is somewhat atypical for ACS and he has had a recent cardiac workup including a stress test as an outpatient hospital last week which she reports  is negative. EKG does show T-wave inversions in V4 and V5 that were not present on prior EKG. These are nondiagnostic. Patient was given full dose aspirin. Basic labwork was sent. Initial troponin is negative. Screening d-dimer was also negative. Chest x-ray is reassuring. Patient continues to be pain-free.  Repeat EKG approximately 3 hours after admission shows no dynamic changes. Repeat troponin (also six-hour troponin) is negative. On repeat exam, patient states that he is having burning in his epigastrium which "Maalox has helped in the past." Patient was given a GI cocktail with relief of his symptoms.  Discussed at length with the patient and his wife that he is to followup closely with cardiology. While patient does have new T wave inversions in V4 and V5, these are  nondiagnostic. Heart score is 3 for age, EKG changes, and risk factors.  Per report he has had a recent workup including chest pain rule out and stress test. His primary care physician has already set him up to see a cardiologist. Suspicion is that his chest pain may be related to known hiatal hernia and reflux as the pain seems to get better with Maalox and GI cocktail.  Patient was given strict return precautions. If he has any new or worsening symptoms she should return immediately. He was encouraged call cardiology office later this morning for evaluation as soon as possible.  After history, exam, and medical workup I feel the patient has been appropriately medically screened and is safe for discharge home. Pertinent diagnoses were discussed with the patient. Patient was given return precautions.   Merryl Hacker, MD 12/15/13 [phone]

## 2013-12-15 NOTE — ED Notes (Signed)
Pt returned from x-ray. Pt monitored by 5-lead, bp cuff, and pulse ox.

## 2013-12-19 ENCOUNTER — Encounter: Payer: Self-pay | Admitting: Cardiology

## 2013-12-19 ENCOUNTER — Ambulatory Visit (INDEPENDENT_AMBULATORY_CARE_PROVIDER_SITE_OTHER): Payer: Medicare Other | Admitting: Cardiology

## 2013-12-19 VITALS — BP 121/73 | HR 52 | Ht 70.0 in | Wt 187.0 lb

## 2013-12-19 DIAGNOSIS — R0789 Other chest pain: Secondary | ICD-10-CM

## 2013-12-19 LAB — VITAMIN D 1,25 DIHYDROXY
VITAMIN D 1, 25 (OH) TOTAL: 48 pg/mL (ref 18–72)
Vitamin D3 1, 25 (OH)2: 48 pg/mL

## 2013-12-19 NOTE — Progress Notes (Signed)
Clinical Summary Jeffrey Dodson is a 68 y.o.male seen today as a new patient for chest pain.  1. Atypical chest pain - episode approx 11 days ago, while driving back from [place] had chest pain. Burning feeling in midchest, 4/10. Radiated to left arm. No other associated symptoms. Nothing made pain better or worst. Pain lasted approx 45 minutes. Drove to ER in [place] and admitted overnight. Pain was similar to prior chest pain he had, but never had arm radiation before  -Reports he had an exercise stress test (sounds like nuclear exercise stress test)  which was reportedly negative.   - similar repeat episode a few days later, went to ER at [facility] visit 12/15/13 with chest pain. Trop neg x 2, D-dimer negative, CXR no acute process. EKG non-specific ST/T changes.  - symptoms better with GI cocktail.  - symptoms typically better with maalox. Just started on zantac with improved symptoms.       2. Chronic bradycardia Past Medical History  Diagnosis Date  . Other chest pain   . Hypercholesterolemia   . Hiatal hernia   . GERD (gastroesophageal reflux disease)   . Diverticulitis of colon   . Hx of urethral stricture   . Degenerative joint disease   . Personal history of skin cancer      No Known Allergies   Current Outpatient Prescriptions  Medication Sig Dispense Refill  . aspirin 81 MG tablet Take 81 mg by mouth daily.      Marland Kitchen ibuprofen (ADVIL,MOTRIN) 200 MG tablet Take 200 mg by mouth daily as needed for mild pain.       . ranitidine (ZANTAC) 150 MG capsule Take 150 mg by mouth daily as needed for heartburn.       No current facility-administered medications for this visit.     Past Surgical History  Procedure Laterality Date  . S/p urethrotomy  11/05    Dr Rosana Hoes     No Known Allergies    Family History  Problem Relation Age of Onset  . Deep vein thrombosis Father 33    Deceased  . Other Father     PTE  . Hypertension Mother    . Congestive Heart Failure    . Diabetes Mellitus II    . Cirrhosis Brother     Liver, deceased  . Stroke Sister   . Diabetes Sister   . COPD Sister      Social History Jeffrey Dodson reports that he has never smoked. He does not have any smokeless tobacco history on file. Jeffrey Dodson has no alcohol history on file.   Review of Systems CONSTITUTIONAL: No weight loss, fever, chills, weakness or fatigue.  HEENT: Eyes: No visual loss, blurred vision, double vision or yellow sclerae.No hearing loss, sneezing, congestion, runny nose or sore throat.  SKIN: No rash or itching.  CARDIOVASCULAR: per HPI RESPIRATORY: No shortness of breath, cough or sputum.  GASTROINTESTINAL: per HPI GENITOURINARY: No burning on urination, no polyuria NEUROLOGICAL: No headache, dizziness, syncope, paralysis, ataxia, numbness or tingling in the extremities. No change in bowel or bladder control.  MUSCULOSKELETAL: No muscle, back pain, joint pain or stiffness.  LYMPHATICS: No enlarged nodes. No history of splenectomy.  PSYCHIATRIC: No history of depression or anxiety.  ENDOCRINOLOGIC: No reports of sweating, cold or heat intolerance. No polyuria or polydipsia.  Marland Kitchen   Physical Examination p 52 bp 121/73 Wt 187 lbs BMI 27 Gen: resting comfortably, no acute distress HEENT:  no scleral icterus, pupils equal round and reactive, no palptable cervical adenopathy,  CV: RRR, no m/r/g, no JVD, no carotid bruits Resp: Clear to auscultation bilaterally GI: abdomen is soft, non-tender, non-distended, normal bowel sounds, no hepatosplenomegaly MSK: extremities are warm, no edema.  Skin: warm, no rash Neuro:  no focal deficits Psych: appropriate affect   Diagnostic Studies 03/2000 Cath DESCRIPTION OF PROCEDURE: After obtaining full and informed consent, the  patient was brought to the cardiac catheterization lab where his right and  left groins were shaved, prepped, and draped in the usual sterile fashion.  ECG  monitoring was established. Using modified Seldinger technique, #6 French  arterial sheath inserted in the right femoral artery. The 6 French diagnostic  catheter was then used to perform diagnostic angiography. This reveals a  large left vein with no significant disease. The LAD is a large vessel which  coursed the apex and gave rise to diagonal branches. The LAD has no  significant disease. The first and second diagonal branches were medium sized  vessels with no significant disease.  Left circumflex was a large vessel which coursed in the A-V groove and gave  rise to three obtuse marginals. The A-V groove and circumflex had no  significant disease. The first OMs and small vessel had no significant  disease. The second OM was a large vessel which bifurcates in its mid portion  and has no significant disease. The third OM is a small vessel with no  significant disease.  The right coronary artery was a large vessel, which was dominant ______both  PDA, as well as posterolateral Jeffrey Dodson. There was no significant disease the  RCA, PDA, or posterolateral Jeffrey Dodson .  LEFT VENTRICULOGRAM: The left ventriculogram revealed a preserved EF 50%.  HEMODYNAMICS: Systemic arterial pressure 122/76, LV systemic pressure 122/10,  LVEDP of 16.  The right femoral site was then closed using a Perclose device without  complications. Ancef 1 gram was given prophylactically.  CONCLUSIONS:  1. No significant coronary artery disease.  2. Normal left ventricular systolic function.     Assessment and Plan  1. Atypical chest pain - atypical symptoms that are better with antacid therapy, reported recent stress test (sounds like exercise MPI by history) during admission to hospital in [place], [place]. Will request records - no indication for further cardiac testing, management of GI symptoms per pcp. Will f/u results of prior stress test.  F/u 1 year      Arnoldo Lenis, M.D., F.A.C.C.

## 2013-12-19 NOTE — Patient Instructions (Signed)
Continue all current medications. Your physician wants you to follow up in:  1 year.  You will receive a reminder letter in the mail one-two months in advance.  If you don't receive a letter, please call our office to schedule the follow up appointment   

## 2013-12-25 DIAGNOSIS — R0789 Other chest pain: Secondary | ICD-10-CM | POA: Insufficient documentation

## 2013-12-25 NOTE — Assessment & Plan Note (Addendum)
Suspect GI, likely GERD related.  EKG obtained revealing sinus bradycardia..  Will have cardiac workup faxed from hospital.  Will obtain labs today.  Referral placed to Cardiology to further r/o cardiac etiology. Encourage daily aspirin. Rx Ranitidine up to BID.  Avoid late-night eating.  Avoid trigger foods.

## 2013-12-28 ENCOUNTER — Institutional Professional Consult (permissible substitution): Payer: Medicare Other | Admitting: Cardiology

## 2013-12-28 ENCOUNTER — Ambulatory Visit (INDEPENDENT_AMBULATORY_CARE_PROVIDER_SITE_OTHER): Payer: Medicare Other | Admitting: Physician Assistant

## 2013-12-28 ENCOUNTER — Ambulatory Visit: Payer: Medicare Other | Admitting: Physician Assistant

## 2013-12-28 ENCOUNTER — Encounter: Payer: Self-pay | Admitting: Physician Assistant

## 2013-12-28 VITALS — BP 118/82 | HR 47 | Temp 97.8°F | Resp 16 | Ht 70.0 in | Wt 187.2 lb

## 2013-12-28 DIAGNOSIS — E785 Hyperlipidemia, unspecified: Secondary | ICD-10-CM

## 2013-12-28 DIAGNOSIS — K219 Gastro-esophageal reflux disease without esophagitis: Secondary | ICD-10-CM

## 2013-12-28 DIAGNOSIS — Z0000 Encounter for general adult medical examination without abnormal findings: Secondary | ICD-10-CM

## 2013-12-28 DIAGNOSIS — R7309 Other abnormal glucose: Secondary | ICD-10-CM

## 2013-12-28 DIAGNOSIS — Z125 Encounter for screening for malignant neoplasm of prostate: Secondary | ICD-10-CM

## 2013-12-28 DIAGNOSIS — Z23 Encounter for immunization: Secondary | ICD-10-CM

## 2013-12-28 DIAGNOSIS — R739 Hyperglycemia, unspecified: Secondary | ICD-10-CM

## 2013-12-28 LAB — COMPREHENSIVE METABOLIC PANEL
ALBUMIN: 4.7 g/dL (ref 3.5–5.2)
ALK PHOS: 65 U/L (ref 39–117)
ALT: 23 U/L (ref 0–53)
AST: 25 U/L (ref 0–37)
BUN: 14 mg/dL (ref 6–23)
CO2: 26 mEq/L (ref 19–32)
Calcium: 9.6 mg/dL (ref 8.4–10.5)
Chloride: 104 mEq/L (ref 96–112)
Creat: 1.23 mg/dL (ref 0.50–1.35)
Glucose, Bld: 86 mg/dL (ref 70–99)
Potassium: 4 mEq/L (ref 3.5–5.3)
Sodium: 141 mEq/L (ref 135–145)
Total Bilirubin: 0.9 mg/dL (ref 0.2–1.2)
Total Protein: 7.1 g/dL (ref 6.0–8.3)

## 2013-12-28 LAB — HEMOGLOBIN A1C
Hgb A1c MFr Bld: 5.7 % — ABNORMAL HIGH (ref <5.7)
Mean Plasma Glucose: 117 mg/dL — ABNORMAL HIGH (ref <117)

## 2013-12-28 MED ORDER — OMEPRAZOLE 20 MG PO CPDR: 20.0000 mg | DELAYED_RELEASE_CAPSULE | Freq: Every day | ORAL | Status: DC

## 2013-12-28 NOTE — Progress Notes (Signed)
Pre visit review using our clinic review tool, if applicable. No additional management support is needed unless otherwise documented below in the visit note/SLS  

## 2013-12-28 NOTE — Patient Instructions (Signed)
Please obtain labs.  I will call you with your results.  Start taking Prilosec daily.  I recommend you continue the Ranitidine for a few days until the Prilosec starts to take affect.  Continue other medications as directed.  Continue diet.  Follow-up in 1 month for acid reflux reassessment.   Return sooner if needed.  Preventive Care for Adults A healthy lifestyle and preventive care can promote health and wellness. Preventive health guidelines for men include the following key practices:  A routine yearly physical is a good way to check with your health care provider about your health and preventative screening. It is a chance to share any concerns and updates on your health and to receive a thorough exam.  Visit your dentist for a routine exam and preventative care every 6 months. Brush your teeth twice a day and floss once a day. Good oral hygiene prevents tooth decay and gum disease.  The frequency of eye exams is based on your age, health, family medical history, use of contact lenses, and other factors. Follow your health care provider's recommendations for frequency of eye exams.  Eat a healthy diet. Foods such as vegetables, fruits, whole grains, low-fat dairy products, and lean protein foods contain the nutrients you need without too many calories. Decrease your intake of foods high in solid fats, added sugars, and salt. Eat the right amount of calories for you.Get information about a proper diet from your health care provider, if necessary.  Regular physical exercise is one of the most important things you can do for your health. Most adults should get at least 150 minutes of moderate-intensity exercise (any activity that increases your heart rate and causes you to sweat) each week. In addition, most adults need muscle-strengthening exercises on 2 or more days a week.  Maintain a healthy weight. The body mass index (BMI) is a screening tool to identify possible weight problems. It provides  an estimate of body fat based on height and weight. Your health care provider can find your BMI and can help you achieve or maintain a healthy weight.For adults 20 years and older:  A BMI below 18.5 is considered underweight.  A BMI of 18.5 to 24.9 is normal.  A BMI of 25 to 29.9 is considered overweight.  A BMI of 30 and above is considered obese.  Maintain normal blood lipids and cholesterol levels by exercising and minimizing your intake of saturated fat. Eat a balanced diet with plenty of fruit and vegetables. Blood tests for lipids and cholesterol should begin at age 31 and be repeated every 5 years. If your lipid or cholesterol levels are high, you are over 50, or you are at high risk for heart disease, you may need your cholesterol levels checked more frequently.Ongoing high lipid and cholesterol levels should be treated with medicines if diet and exercise are not working.  If you smoke, find out from your health care provider how to quit. If you do not use tobacco, do not start.  Lung cancer screening is recommended for adults aged 54-80 years who are at high risk for developing lung cancer because of a history of smoking. A yearly low-dose CT scan of the lungs is recommended for people who have at least a 30-pack-year history of smoking and are a current smoker or have quit within the past 15 years. A pack year of smoking is smoking an average of 1 pack of cigarettes a day for 1 year (for example: 1 pack a day  for 30 years or 2 packs a day for 15 years). Yearly screening should continue until the smoker has stopped smoking for at least 15 years. Yearly screening should be stopped for people who develop a health problem that would prevent them from having lung cancer treatment.  If you choose to drink alcohol, do not have more than 2 drinks per day. One drink is considered to be 12 ounces (355 mL) of beer, 5 ounces (148 mL) of wine, or 1.5 ounces (44 mL) of liquor.  Avoid use of street  drugs. Do not share needles with anyone. Ask for help if you need support or instructions about stopping the use of drugs.  High blood pressure causes heart disease and increases the risk of stroke. Your blood pressure should be checked at least every 1-2 years. Ongoing high blood pressure should be treated with medicines, if weight loss and exercise are not effective.  If you are 16-37 years old, ask your health care provider if you should take aspirin to prevent heart disease.  Diabetes screening involves taking a blood sample to check your fasting blood sugar level. This should be done once every 3 years, after age 27, if you are within normal weight and without risk factors for diabetes. Testing should be considered at a younger age or be carried out more frequently if you are overweight and have at least 1 risk factor for diabetes.  Colorectal cancer can be detected and often prevented. Most routine colorectal cancer screening begins at the age of 72 and continues through age 37. However, your health care provider may recommend screening at an earlier age if you have risk factors for colon cancer. On a yearly basis, your health care provider may provide home test kits to check for hidden blood in the stool. Use of a small camera at the end of a tube to directly examine the colon (sigmoidoscopy or colonoscopy) can detect the earliest forms of colorectal cancer. Talk to your health care provider about this at age 24, when routine screening begins. Direct exam of the colon should be repeated every 5-10 years through age 6, unless early forms of precancerous polyps or small growths are found.  People who are at an increased risk for hepatitis B should be screened for this virus. You are considered at high risk for hepatitis B if:  You were born in a country where hepatitis B occurs often. Talk with your health care provider about which countries are considered high risk.  Your parents were born in a  high-risk country and you have not received a shot to protect against hepatitis B (hepatitis B vaccine).  You have HIV or AIDS.  You use needles to inject street drugs.  You live with, or have sex with, someone who has hepatitis B.  You are a man who has sex with other men (MSM).  You get hemodialysis treatment.  You take certain medicines for conditions such as cancer, organ transplantation, and autoimmune conditions.  Hepatitis C blood testing is recommended for all people born from [birth year] through 1965 and any individual with known risks for hepatitis C.  Practice safe sex. Use condoms and avoid high-risk sexual practices to reduce the spread of sexually transmitted infections (STIs). STIs include gonorrhea, chlamydia, syphilis, trichomonas, herpes, HPV, and human immunodeficiency virus (HIV). Herpes, HIV, and HPV are viral illnesses that have no cure. They can result in disability, cancer, and death.  If you are at risk of being infected with HIV, it  is recommended that you take a prescription medicine daily to prevent HIV infection. This is called preexposure prophylaxis (PrEP). You are considered at risk if:  You are a man who has sex with other men (MSM) and have other risk factors.  You are a heterosexual man, are sexually active, and are at increased risk for HIV infection.  You take drugs by injection.  You are sexually active with a partner who has HIV.  Talk with your health care provider about whether you are at high risk of being infected with HIV. If you choose to begin PrEP, you should first be tested for HIV. You should then be tested every 3 months for as long as you are taking PrEP.  A one-time screening for abdominal aortic aneurysm (AAA) and surgical repair of large AAAs by ultrasound are recommended for men ages 80 to 72 years who are current or former smokers.  Healthy men should no longer receive prostate-specific antigen (PSA) blood tests as part of routine  cancer screening. Talk with your health care provider about prostate cancer screening.  Testicular cancer screening is not recommended for adult males who have no symptoms. Screening includes self-exam, a health care provider exam, and other screening tests. Consult with your health care provider about any symptoms you have or any concerns you have about testicular cancer.  Use sunscreen. Apply sunscreen liberally and repeatedly throughout the day. You should seek shade when your shadow is shorter than you. Protect yourself by wearing long sleeves, pants, a wide-brimmed hat, and sunglasses year round, whenever you are outdoors.  Once a month, do a whole-body skin exam, using a mirror to look at the skin on your back. Tell your health care provider about new moles, moles that have irregular borders, moles that are larger than a pencil eraser, or moles that have changed in shape or color.  Stay current with required vaccines (immunizations).  Influenza vaccine. All adults should be immunized every year.  Tetanus, diphtheria, and acellular pertussis (Td, Tdap) vaccine. An adult who has not previously received Tdap or who does not know his vaccine status should receive 1 dose of Tdap. This initial dose should be followed by tetanus and diphtheria toxoids (Td) booster doses every 10 years. Adults with an unknown or incomplete history of completing a 3-dose immunization series with Td-containing vaccines should begin or complete a primary immunization series including a Tdap dose. Adults should receive a Td booster every 10 years.  Varicella vaccine. An adult without evidence of immunity to varicella should receive 2 doses or a second dose if he has previously received 1 dose.  Human papillomavirus (HPV) vaccine. Males aged 77-21 years who have not received the vaccine previously should receive the 3-dose series. Males aged 22-26 years may be immunized. Immunization is recommended through the age of 44  years for any male who has sex with males and did not get any or all doses earlier. Immunization is recommended for any person with an immunocompromised condition through the age of 37 years if he did not get any or all doses earlier. During the 3-dose series, the second dose should be obtained 4-8 weeks after the first dose. The third dose should be obtained 24 weeks after the first dose and 16 weeks after the second dose.  Zoster vaccine. One dose is recommended for adults aged 20 years or older unless certain conditions are present.  Measles, mumps, and rubella (MMR) vaccine. Adults born before [birth year] generally are considered immune to measles  and mumps. Adults born in [birth year] or later should have 1 or more doses of MMR vaccine unless there is a contraindication to the vaccine or there is laboratory evidence of immunity to each of the three diseases. A routine second dose of MMR vaccine should be obtained at least 28 days after the first dose for students attending postsecondary schools, health care workers, or international travelers. People who received inactivated measles vaccine or an unknown type of measles vaccine during 1963-1967 should receive 2 doses of MMR vaccine. People who received inactivated mumps vaccine or an unknown type of mumps vaccine before 1979 and are at high risk for mumps infection should consider immunization with 2 doses of MMR vaccine. Unvaccinated health care workers born before [birth year] who lack laboratory evidence of measles, mumps, or rubella immunity or laboratory confirmation of disease should consider measles and mumps immunization with 2 doses of MMR vaccine or rubella immunization with 1 dose of MMR vaccine.  Pneumococcal 13-valent conjugate (PCV13) vaccine. When indicated, a person who is uncertain of his immunization history and has no record of immunization should receive the PCV13 vaccine. An adult aged 78 years or older who has certain medical conditions and has not been  previously immunized should receive 1 dose of PCV13 vaccine. This PCV13 should be followed with a dose of pneumococcal polysaccharide (PPSV23) vaccine. The PPSV23 vaccine dose should be obtained at least 8 weeks after the dose of PCV13 vaccine. An adult aged 18 years or older who has certain medical conditions and previously received 1 or more doses of PPSV23 vaccine should receive 1 dose of PCV13. The PCV13 vaccine dose should be obtained 1 or more years after the last PPSV23 vaccine dose.  Pneumococcal polysaccharide (PPSV23) vaccine. When PCV13 is also indicated, PCV13 should be obtained first. All adults aged 76 years and older should be immunized. An adult younger than age 27 years who has certain medical conditions should be immunized. Any person who resides in a nursing home or long-term care facility should be immunized. An adult smoker should be immunized. People with an immunocompromised condition and certain other conditions should receive both PCV13 and PPSV23 vaccines. People with human immunodeficiency virus (HIV) infection should be immunized as soon as possible after diagnosis. Immunization during chemotherapy or radiation therapy should be avoided. Routine use of PPSV23 vaccine is not recommended for American Indians, [place] Natives, or people younger than 65 years unless there are medical conditions that require PPSV23 vaccine. When indicated, people who have unknown immunization and have no record of immunization should receive PPSV23 vaccine. One-time revaccination 5 years after the first dose of PPSV23 is recommended for people aged 19-64 years who have chronic kidney failure, nephrotic syndrome, asplenia, or immunocompromised conditions. People who received 1-2 doses of PPSV23 before age 42 years should receive another dose of PPSV23 vaccine at age 37 years or later if at least 5 years have passed since the previous dose. Doses of PPSV23 are not needed for people immunized with PPSV23 at or  after age 43 years.  Meningococcal vaccine. Adults with asplenia or persistent complement component deficiencies should receive 2 doses of quadrivalent meningococcal conjugate (MenACWY-D) vaccine. The doses should be obtained at least 2 months apart. Microbiologists working with certain meningococcal bacteria, [employer] recruits, people at risk during an outbreak, and people who travel to or live in countries with a high rate of meningitis should be immunized. A first-year college student up through age 20 years who is living in a residence hall  should receive a dose if he did not receive a dose on or after his 16th birthday. Adults who have certain high-risk conditions should receive one or more doses of vaccine.  Hepatitis A vaccine. Adults who wish to be protected from this disease, have certain high-risk conditions, work with hepatitis A-infected animals, work in hepatitis A research labs, or travel to or work in countries with a high rate of hepatitis A should be immunized. Adults who were previously unvaccinated and who anticipate close contact with an international adoptee during the first 60 days after arrival in the [place] from a country with a high rate of hepatitis A should be immunized.  Hepatitis B vaccine. Adults should be immunized if they wish to be protected from this disease, have certain high-risk conditions, may be exposed to blood or other infectious body fluids, are household contacts or sex partners of hepatitis B positive people, are clients or workers in certain care facilities, or travel to or work in countries with a high rate of hepatitis B.  Haemophilus influenzae type b (Hib) vaccine. A previously unvaccinated person with asplenia or sickle cell disease or having a scheduled splenectomy should receive 1 dose of Hib vaccine. Regardless of previous immunization, a recipient of a hematopoietic stem cell transplant should receive a 3-dose series 6-12 months after his  successful transplant. Hib vaccine is not recommended for adults with HIV infection. Preventive Service / Frequency Ages 52 to 17  Blood pressure check.** / Every 1 to 2 years.  Lipid and cholesterol check.** / Every 5 years beginning at age 57.  Hepatitis C blood test.** / For any individual with known risks for hepatitis C.  Skin self-exam. / Monthly.  Influenza vaccine. / Every year.  Tetanus, diphtheria, and acellular pertussis (Tdap, Td) vaccine.** / Consult your health care provider. 1 dose of Td every 10 years.  Varicella vaccine.** / Consult your health care provider.  HPV vaccine. / 3 doses over 6 months, if 45 or younger.  Measles, mumps, rubella (MMR) vaccine.** / You need at least 1 dose of MMR if you were born in 1957 or later. You may also need a second dose.  Pneumococcal 13-valent conjugate (PCV13) vaccine.** / Consult your health care provider.  Pneumococcal polysaccharide (PPSV23) vaccine.** / 1 to 2 doses if you smoke cigarettes or if you have certain conditions.  Meningococcal vaccine.** / 1 dose if you are age 11 to 66 years and a Market researcher living in a residence hall, or have one of several medical conditions. You may also need additional booster doses.  Hepatitis A vaccine.** / Consult your health care provider.  Hepatitis B vaccine.** / Consult your health care provider.  Haemophilus influenzae type b (Hib) vaccine.** / Consult your health care provider. Ages 34 to 15  Blood pressure check.** / Every 1 to 2 years.  Lipid and cholesterol check.** / Every 5 years beginning at age 65.  Lung cancer screening. / Every year if you are aged 44-80 years and have a 30-pack-year history of smoking and currently smoke or have quit within the past 15 years. Yearly screening is stopped once you have quit smoking for at least 15 years or develop a health problem that would prevent you from having lung cancer treatment.  Fecal occult blood test (FOBT)  of stool. / Every year beginning at age 8 and continuing until age 29. You may not have to do this test if you get a colonoscopy every 10 years.  Flexible  sigmoidoscopy** or colonoscopy.** / Every 5 years for a flexible sigmoidoscopy or every 10 years for a colonoscopy beginning at age 28 and continuing until age 47.  Hepatitis C blood test.** / For all people born from [birth year] through 1963 and any individual with known risks for hepatitis C.  Skin self-exam. / Monthly.  Influenza vaccine. / Every year.  Tetanus, diphtheria, and acellular pertussis (Tdap/Td) vaccine.** / Consult your health care provider. 1 dose of Td every 10 years.  Varicella vaccine.** / Consult your health care provider.  Zoster vaccine.** / 1 dose for adults aged 37 years or older.  Measles, mumps, rubella (MMR) vaccine.** / You need at least 1 dose of MMR if you were born in 1955 or later. You may also need a second dose.  Pneumococcal 13-valent conjugate (PCV13) vaccine.** / Consult your health care provider.  Pneumococcal polysaccharide (PPSV23) vaccine.** / 1 to 2 doses if you smoke cigarettes or if you have certain conditions.  Meningococcal vaccine.** / Consult your health care provider.  Hepatitis A vaccine.** / Consult your health care provider.  Hepatitis B vaccine.** / Consult your health care provider.  Haemophilus influenzae type b (Hib) vaccine.** / Consult your health care provider. Ages 80 and over  Blood pressure check.** / Every 1 to 2 years.  Lipid and cholesterol check.**/ Every 5 years beginning at age 35.  Lung cancer screening. / Every year if you are aged 79-80 years and have a 30-pack-year history of smoking and currently smoke or have quit within the past 15 years. Yearly screening is stopped once you have quit smoking for at least 15 years or develop a health problem that would prevent you from having lung cancer treatment.  Fecal occult blood test (FOBT) of stool. / Every year  beginning at age 14 and continuing until age 53. You may not have to do this test if you get a colonoscopy every 10 years.  Flexible sigmoidoscopy** or colonoscopy.** / Every 5 years for a flexible sigmoidoscopy or every 10 years for a colonoscopy beginning at age 48 and continuing until age 21.  Hepatitis C blood test.** / For all people born from [birth year] through 1963 and any individual with known risks for hepatitis C.  Abdominal aortic aneurysm (AAA) screening.** / A one-time screening for ages 22 to 85 years who are current or former smokers.  Skin self-exam. / Monthly.  Influenza vaccine. / Every year.  Tetanus, diphtheria, and acellular pertussis (Tdap/Td) vaccine.** / 1 dose of Td every 10 years.  Varicella vaccine.** / Consult your health care provider.  Zoster vaccine.** / 1 dose for adults aged 59 years or older.  Pneumococcal 13-valent conjugate (PCV13) vaccine.** / Consult your health care provider.  Pneumococcal polysaccharide (PPSV23) vaccine.** / 1 dose for all adults aged 34 years and older.  Meningococcal vaccine.** / Consult your health care provider.  Hepatitis A vaccine.** / Consult your health care provider.  Hepatitis B vaccine.** / Consult your health care provider.  Haemophilus influenzae type b (Hib) vaccine.** / Consult your health care provider. **Family history and personal history of risk and conditions may change your health care provider's recommendations. Document Released: 07/29/2001 Document Revised: 06/07/2013 Document Reviewed: 10/28/2010 [facility] Patient Information 2015 [place], [place]. This information is not intended to replace advice given to you by your health care provider. Make sure you discuss any questions you have with your health care provider.

## 2013-12-28 NOTE — Progress Notes (Signed)
Subjective:   Patient here for Medicare annual wellness visit.  Roster of Physicians Providing Medical Care to Patient: Brunetta Jeans, PA-C -- Family Medicine Dr. Harl Bowie -- Cardiology  Activities of Daily Living In your present state of health, do you have any difficulty performing the following activities? Preparing food and eating?: No  Bathing yourself: No  Getting dressed: No  Using the toilet:No  Moving around from place to place: No  In the past year have you fallen or had a near fall?:No     Home Safety: Has smoke detector and wears seat belts. Has firearm that is kept locked up. No excess sun exposure.  Diet and Exercise  Current exercise habits: walks daily Dietary issues discussed: healthy diet; eats good amount of fish  Depression Screen  (Note: if answer to either of the following is "Yes", then a more complete depression screening is indicated)  Q1: Over the past two weeks, have you felt down, depressed or hopeless?no  Q2: Over the past two weeks, have you felt little interest or pleasure in doing things? no   The following portions of the patient's history were reviewed and updated as appropriate: allergies, current medications, past family history, past medical history, past social history, past surgical history and problem list.   Review of Systems  Review of Systems  Constitutional: Negative for fever.  HENT: Negative for ear discharge, ear pain, hearing loss and tinnitus.   Eyes: Negative for blurred vision and double vision.  Respiratory: Negative for cough and shortness of breath.   Cardiovascular: Negative for chest pain and palpitations.  Gastrointestinal: Positive for heartburn. Negative for nausea, vomiting, abdominal pain, diarrhea, constipation, blood in stool and melena.  Genitourinary: Negative for dysuria, urgency, frequency, hematuria and flank pain.  Neurological: Negative for dizziness, loss of consciousness and headaches.   Endo/Heme/Allergies: Negative for environmental allergies.  Psychiatric/Behavioral: Negative for depression, suicidal ideas, hallucinations and substance abuse. The patient is not nervous/anxious and does not have insomnia.    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 Body mass index: Body mass index is 26.87 kg/(m^2). Cognitive Impairment Assessment: cognition, memory and judgment appear normal.   Physical Exam  Vitals reviewed. Constitutional: He is oriented to person, place, and time and well-developed, well-nourished, and in no distress.  HENT:  Head: Normocephalic and atraumatic.  Right Ear: External ear normal.  Left Ear: External ear normal.  Nose: Nose normal.  Mouth/Throat: Oropharynx is clear and moist. No oropharyngeal exudate.  TM within normal limits bilaterally.   Eyes: Conjunctivae are normal.  Neck: Neck supple. No thyromegaly present.  Cardiovascular: Normal rate, regular rhythm, normal heart sounds and intact distal pulses.   Pulmonary/Chest: Effort normal and breath sounds normal. No respiratory distress. He has no wheezes. He has no rales. He exhibits no tenderness.  Abdominal: Soft. Bowel sounds are normal. He exhibits no distension and no mass. There is no tenderness. There is no rebound and no guarding.  Lymphadenopathy:    He has no cervical adenopathy.  Neurological: He is alert and oriented to person, place, and time. No cranial nerve deficit.  Skin: Skin is warm and dry. No rash noted.  Psychiatric: Affect normal.    Assessment:   (1) Medicare wellness utd on preventive parameters (2) GERD (3) Hyperlipidemia  Plan:   (1) During the course of the visit the patient was educated and counseled about appropriate screening and preventive services  including: Fall prevention, Diabetes screening, Nutrition counseling.  Prevnar given.  Colonoscopy due in 2018.  Patient will defer Tetanus  vaccination to next visit.  Will obtain fasting labs to include PSA.  (2) Will switch from Ranitidine to Nexium.  Avoid trigger foods. Avoid late-night eating.  (3) Will obtain fasting lipid panel and hepatic panel.   Patient Instructions (the written plan) was given to the patient.

## 2013-12-29 LAB — URINALYSIS, ROUTINE W REFLEX MICROSCOPIC
BILIRUBIN URINE: NEGATIVE
GLUCOSE, UA: NEGATIVE mg/dL
HGB URINE DIPSTICK: NEGATIVE
Ketones, ur: NEGATIVE mg/dL
Leukocytes, UA: NEGATIVE
Nitrite: NEGATIVE
PH: 5 (ref 5.0–8.0)
PROTEIN: NEGATIVE mg/dL
Specific Gravity, Urine: 1.026 (ref 1.005–1.030)
Urobilinogen, UA: 0.2 mg/dL (ref 0.0–1.0)

## 2013-12-29 LAB — URINALYSIS, MICROSCOPIC ONLY
Bacteria, UA: NONE SEEN
CASTS: NONE SEEN
CRYSTALS: NONE SEEN
Squamous Epithelial / LPF: NONE SEEN

## 2013-12-29 LAB — PSA: PSA: 1.12 ng/mL (ref <=4.00)

## 2014-02-01 ENCOUNTER — Ambulatory Visit (INDEPENDENT_AMBULATORY_CARE_PROVIDER_SITE_OTHER): Payer: Medicare Other | Admitting: Physician Assistant

## 2014-02-01 ENCOUNTER — Encounter: Payer: Self-pay | Admitting: Physician Assistant

## 2014-02-01 VITALS — BP 130/96 | HR 54 | Temp 98.0°F | Resp 16 | Ht 70.0 in | Wt 193.5 lb

## 2014-02-01 DIAGNOSIS — K219 Gastro-esophageal reflux disease without esophagitis: Secondary | ICD-10-CM

## 2014-02-01 NOTE — Assessment & Plan Note (Addendum)
Well controlled. Continue current regimen. Follow up in  6 months.  

## 2014-02-01 NOTE — Progress Notes (Signed)
Pre visit review using our clinic review tool, if applicable. No additional management support is needed unless otherwise documented below in the visit note/SLS  

## 2014-02-01 NOTE — Progress Notes (Signed)
Patient presents to clinic today for follow-up of GERD.  Doing well on Zantac QD.  Denies heartburn, dysphagia, globus, abdominal pain, nausea or vomiting.  Denies new concern today.  Past Medical History  Diagnosis Date  . Other chest pain   . Hypercholesterolemia   . Hiatal hernia   . GERD (gastroesophageal reflux disease)   . Diverticulitis of colon   . Hx of urethral stricture   . Degenerative joint disease   . Personal history of skin cancer     Current Outpatient Prescriptions on File Prior to Visit  Medication Sig Dispense Refill  . aspirin 81 MG tablet Take 81 mg by mouth daily.      Marland Kitchen ibuprofen (ADVIL,MOTRIN) 200 MG tablet Take 200 mg by mouth daily as needed for mild pain.       . ranitidine (ZANTAC) 150 MG capsule Take 150 mg by mouth daily as needed for heartburn.      Marland Kitchen KRILL OIL PO Take by mouth.       No current facility-administered medications on file prior to visit.    No Known Allergies  Family History  Problem Relation Age of Onset  . Deep vein thrombosis Father 63    Deceased  . Other Father     PTE  . Hypertension Mother   . Congestive Heart Failure    . Diabetes Mellitus II    . Cirrhosis Brother     Liver, deceased  . Stroke Sister   . Diabetes Sister   . COPD Sister     History   Social History  . Marital Status: Married    Spouse Name: N/A    Number of Children: N/A  . Years of Education: N/A   Social History Main Topics  . Smoking status: Never Smoker   . Smokeless tobacco: None  . Alcohol Use: None  . Drug Use: None  . Sexual Activity: None   Other Topics Concern  . None   Social History Narrative   Married to [employer]   Non-smoker   No ETOH         Review of Systems - See HPI.  All other ROS are negative.  BP 130/96  Pulse 54  Temp(Src) 98 F (36.7 C) (Oral)  Resp 16  Ht 5' 10" (1.778 m)  Wt 193 lb 8 oz (87.771 kg)  BMI 27.76 kg/m2  SpO2 98%  Physical Exam  Vitals  reviewed. Constitutional: He is oriented to person, place, and time and well-developed, well-nourished, and in no distress.  HENT:  Head: Normocephalic and atraumatic.  Eyes: Conjunctivae are normal.  Cardiovascular: Normal rate, regular rhythm and normal heart sounds.   Pulmonary/Chest: Effort normal and breath sounds normal. No respiratory distress. He has no wheezes. He has no rales. He exhibits no tenderness.  Abdominal: Soft. Bowel sounds are normal. He exhibits no distension and no mass. There is no tenderness. There is no rebound and no guarding.  Neurological: He is alert and oriented to person, place, and time.  Skin: Skin is warm and dry. No rash noted.  Psychiatric: Affect normal.    Recent Results (from the past 2160 hour(s))  COMPREHENSIVE METABOLIC PANEL     Status: Abnormal   Collection Time    12/12/13 11:43 AM      Result Value Ref Range   Sodium 141  135 - 145 mEq/L   Potassium 4.5  3.5 - 5.1 mEq/L   Chloride 104  96 -  112 mEq/L   CO2 27  19 - 32 mEq/L   Glucose, Bld 47 (*) 70 - 99 mg/dL   BUN 17  6 - 23 mg/dL   Creatinine, Ser 1.2  0.4 - 1.5 mg/dL   Total Bilirubin 0.9  0.2 - 1.2 mg/dL   Alkaline Phosphatase 63  39 - 117 U/L   AST 34  0 - 37 U/L   ALT 36  0 - 53 U/L   Total Protein 7.9  6.0 - 8.3 g/dL   Albumin 4.7  3.5 - 5.2 g/dL   Calcium 9.8  8.4 - 10.5 mg/dL   GFR 67.65  >60.00 mL/min  LIPID PANEL     Status: Abnormal   Collection Time    12/12/13 11:43 AM      Result Value Ref Range   Cholesterol 176  0 - 200 mg/dL   Comment: ATP III Classification       Desirable:  < 200 mg/dL               Borderline High:  200 - 239 mg/dL          High:  > = 240 mg/dL   Triglycerides 224.0 (*) 0.0 - 149.0 mg/dL   Comment: Normal:  <150 mg/dLBorderline High:  150 - 199 mg/dL   HDL 41.00  >39.00 mg/dL   VLDL 44.8 (*) 0.0 - 40.0 mg/dL   LDL Cholesterol 90  0 - 99 mg/dL   Total CHOL/HDL Ratio 4     Comment:                Men          Women1/2 Average Risk     3.4           3.3Average Risk          5.0          4.42X Average Risk          9.6          7.13X Average Risk          15.0          11.0                       NonHDL 135.00    GLUCOSE, POCT (MANUAL RESULT ENTRY)     Status: None   Collection Time    12/14/13 11:41 AM      Result Value Ref Range   POC Glucose 75  70 - 99 mg/dl   Comment: Fasting  TSH     Status: None   Collection Time    12/14/13 12:32 PM      Result Value Ref Range   TSH 1.124  0.350 - 4.500 uIU/mL  VITAMIN D 1,25 DIHYDROXY     Status: None   Collection Time    12/14/13 12:32 PM      Result Value Ref Range   Vitamin D 1, 25 (OH)2 Total 48  18 - 72 pg/mL   Vitamin D3 1, 25 (OH)2 48     Vitamin D2 1, 25 (OH)2 <8     Comment: Vitamin D3, 1,25(OH)2 indicates both endogenous     production and supplementation.  Vitamin D2, 1,25(OH)2     is an indicator of exogeous sources, such as diet or     supplementation.  Interpretation and therapy are based     on measurement of Vitamin D,1,25(OH)2, Total.  This test was developed and its performance     characteristics have been determined by [employer], [place], [place].     Performance characteristics refer to the     analytical performance of the test.  CBC     Status: None   Collection Time    12/14/13 12:32 PM      Result Value Ref Range   WBC 8.0  4.0 - 10.5 K/uL   RBC 4.91  4.22 - 5.81 MIL/uL   Hemoglobin 14.8  13.0 - 17.0 g/dL   HCT 41.5  39.0 - 52.0 %   MCV 84.5  78.0 - 100.0 fL   MCH 30.1  26.0 - 34.0 pg   MCHC 35.7  30.0 - 36.0 g/dL   RDW 13.3  11.5 - 15.5 %   Platelets 165  150 - 400 K/uL  BASIC METABOLIC PANEL     Status: None   Collection Time    12/14/13 12:32 PM      Result Value Ref Range   Sodium 139  135 - 145 mEq/L   Potassium 4.5  3.5 - 5.3 mEq/L   Chloride 106  96 - 112 mEq/L   CO2 26  19 - 32 mEq/L   Glucose, Bld 78  70 - 99 mg/dL   BUN 17  6 - 23 mg/dL   Creat 1.18  0.50 - 1.35 mg/dL   Calcium 9.7  8.4 - 10.5  mg/dL  CBC WITH DIFFERENTIAL     Status: Abnormal   Collection Time    12/15/13 12:19 AM      Result Value Ref Range   WBC 8.3  4.0 - 10.5 K/uL   RBC 4.57  4.22 - 5.81 MIL/uL   Hemoglobin 13.6  13.0 - 17.0 g/dL   HCT 40.0  39.0 - 52.0 %   MCV 87.5  78.0 - 100.0 fL   MCH 29.8  26.0 - 34.0 pg   MCHC 34.0  30.0 - 36.0 g/dL   RDW 12.5  11.5 - 15.5 %   Platelets 148 (*) 150 - 400 K/uL   Neutrophils Relative % 68  43 - 77 %   Neutro Abs 5.6  1.7 - 7.7 K/uL   Lymphocytes Relative 22  12 - 46 %   Lymphs Abs 1.9  0.7 - 4.0 K/uL   Monocytes Relative 6  3 - 12 %   Monocytes Absolute 0.5  0.1 - 1.0 K/uL   Eosinophils Relative 2  0 - 5 %   Eosinophils Absolute 0.1  0.0 - 0.7 K/uL   Basophils Relative 2 (*) 0 - 1 %   Basophils Absolute 0.1  0.0 - 0.1 K/uL  BASIC METABOLIC PANEL     Status: Abnormal   Collection Time    12/15/13 12:19 AM      Result Value Ref Range   Sodium 139  137 - 147 mEq/L   Potassium 4.1  3.7 - 5.3 mEq/L   Chloride 100  96 - 112 mEq/L   CO2 25  19 - 32 mEq/L   Glucose, Bld 134 (*) 70 - 99 mg/dL   BUN 17  6 - 23 mg/dL   Creatinine, Ser 1.11  0.50 - 1.35 mg/dL   Calcium 9.4  8.4 - 10.5 mg/dL   GFR calc non Af Amer 68 (*) >90 mL/min   GFR calc Af Amer 79 (*) >90 mL/min   Comment: (NOTE)     The eGFR  has been calculated using the CKD EPI equation.     This calculation has not been validated in all clinical situations.     eGFR's persistently <90 mL/min signify possible Chronic Kidney     Disease.   Anion gap 14  5 - 15  TROPONIN I     Status: None   Collection Time    12/15/13 12:19 AM      Result Value Ref Range   Troponin I <0.30  <0.30 ng/mL   Comment:            Due to the release kinetics of cTnI,     a negative result within the first hours     of the onset of symptoms does not rule out     myocardial infarction with certainty.     If myocardial infarction is still suspected,     repeat the test at appropriate intervals.  D-DIMER, QUANTITATIVE      Status: None   Collection Time    12/15/13 12:19 AM      Result Value Ref Range   D-Dimer, Quant <0.27  0.00 - 0.48 ug/mL-FEU   Comment:            AT THE INHOUSE ESTABLISHED CUTOFF     VALUE OF 0.48 ug/mL FEU,     THIS ASSAY HAS BEEN DOCUMENTED     IN THE LITERATURE TO HAVE     A SENSITIVITY AND NEGATIVE     PREDICTIVE VALUE OF AT LEAST     98 TO 99%.  THE TEST RESULT     SHOULD BE CORRELATED WITH     AN ASSESSMENT OF THE CLINICAL     PROBABILITY OF DVT / VTE.  TROPONIN I     Status: None   Collection Time    12/15/13  2:58 AM      Result Value Ref Range   Troponin I <0.30  <0.30 ng/mL   Comment:            Due to the release kinetics of cTnI,     a negative result within the first hours     of the onset of symptoms does not rule out     myocardial infarction with certainty.     If myocardial infarction is still suspected,     repeat the test at appropriate intervals.  COMPREHENSIVE METABOLIC PANEL     Status: None   Collection Time    12/28/13  9:32 AM      Result Value Ref Range   Sodium 141  135 - 145 mEq/L   Potassium 4.0  3.5 - 5.3 mEq/L   Chloride 104  96 - 112 mEq/L   CO2 26  19 - 32 mEq/L   Glucose, Bld 86  70 - 99 mg/dL   BUN 14  6 - 23 mg/dL   Creat 1.23  0.50 - 1.35 mg/dL   Total Bilirubin 0.9  0.2 - 1.2 mg/dL   Alkaline Phosphatase 65  39 - 117 U/L   AST 25  0 - 37 U/L   ALT 23  0 - 53 U/L   Total Protein 7.1  6.0 - 8.3 g/dL   Albumin 4.7  3.5 - 5.2 g/dL   Calcium 9.6  8.4 - 10.5 mg/dL  HEMOGLOBIN A1C     Status: Abnormal   Collection Time    12/28/13  9:32 AM      Result Value Ref Range   Hemoglobin A1C 5.7 (*) <  5.7 %   Comment:                                                                            According to the ADA Clinical Practice Recommendations for 2011, when     HbA1c is used as a screening test:             >=6.5%   Diagnostic of Diabetes Mellitus                (if abnormal result is confirmed)           5.7-6.4%   Increased risk  of developing Diabetes Mellitus           References:Diagnosis and Classification of Diabetes Mellitus,Diabetes     UKGU,5427,06(CBJSE 1):S62-S69 and Standards of Medical Care in             Diabetes - 2011,Diabetes GBTD,1761,60 (Suppl 1):S11-S61.         Mean Plasma Glucose 117 (*) <117 mg/dL  URINALYSIS, ROUTINE W REFLEX MICROSCOPIC     Status: Abnormal   Collection Time    12/28/13  9:32 AM      Result Value Ref Range   Color, Urine YELLOW  YELLOW   APPearance TURBID (*) CLEAR   Specific Gravity, Urine 1.026  1.005 - 1.030   pH 5.0  5.0 - 8.0   Glucose, UA NEG  NEG mg/dL   Bilirubin Urine NEG  NEG   Ketones, ur NEG  NEG mg/dL   Hgb urine dipstick NEG  NEG   Protein, ur NEG  NEG mg/dL   Urobilinogen, UA 0.2  0.0 - 1.0 mg/dL   Nitrite NEG  NEG   Leukocytes, UA NEG  NEG  PSA     Status: None   Collection Time    12/28/13  9:32 AM      Result Value Ref Range   PSA 1.12  <=4.00 ng/mL   Comment: Test Methodology: ECLIA PSA (Electrochemiluminescence Immunoassay)           For PSA values from 2.5-4.0, particularly in younger men <60 years     old, the AUA and NCCN suggest testing for % Free PSA (3515) and     evaluation of the rate of increase in PSA (PSA velocity).  URINALYSIS, MICROSCOPIC ONLY     Status: None   Collection Time    12/28/13  9:32 AM      Result Value Ref Range   Squamous Epithelial / LPF NONE SEEN  RARE   Crystals NONE SEEN  NONE SEEN   Casts NONE SEEN  NONE SEEN   WBC, UA 0-2  <3 WBC/hpf   RBC / HPF 0-2  <3 RBC/hpf   Bacteria, UA NONE SEEN  RARE    Assessment/Plan: GERD Well-controlled.  Continue current regimen.  Follow-up in 6 months.

## 2014-02-01 NOTE — Patient Instructions (Addendum)
I am glad you are doing well.  Please continue Zantac daily.  Take a daily probiotic.  Avoid late-night eating and keep the head of your bed elevated. Avoid trigger foods.  Follow-up in 6 months for medication management and reassessment.  Food Choices for Gastroesophageal Reflux Disease When you have gastroesophageal reflux disease (GERD), the foods you eat and your eating habits are very important. Choosing the right foods can help ease the discomfort of GERD. WHAT GENERAL GUIDELINES DO I NEED TO FOLLOW?  Choose fruits, vegetables, whole grains, low-fat dairy products, and low-fat meat, fish, and poultry.  Limit fats such as oils, salad dressings, butter, nuts, and avocado.  Keep a food diary to identify foods that cause symptoms.  Avoid foods that cause reflux. These may be different for different people.  Eat frequent small meals instead of three large meals each day.  Eat your meals slowly, in a relaxed setting.  Limit fried foods.  Cook foods using methods other than frying.  Avoid drinking alcohol.  Avoid drinking large amounts of liquids with your meals.  Avoid bending over or lying down until 2-3 hours after eating. WHAT FOODS ARE NOT RECOMMENDED? The following are some foods and drinks that may worsen your symptoms: Vegetables Tomatoes. Tomato juice. Tomato and spaghetti sauce. Chili peppers. Onion and garlic. Horseradish. Fruits Oranges, grapefruit, and lemon (fruit and juice). Meats High-fat meats, fish, and poultry. This includes hot dogs, ribs, ham, sausage, salami, and bacon. Dairy Whole milk and chocolate milk. Sour cream. Cream. Butter. Ice cream. Cream cheese.  Beverages Coffee and tea, with or without caffeine. Carbonated beverages or energy drinks. Condiments Hot sauce. Barbecue sauce.  Sweets/Desserts Chocolate and cocoa. Donuts. Peppermint and spearmint. Fats and Oils High-fat foods, including [place] fries and potato chips. Other Vinegar. Strong  spices, such as black pepper, white pepper, red pepper, cayenne, curry powder, cloves, ginger, and chili powder. The items listed above may not be a complete list of foods and beverages to avoid. Contact your dietitian for more information. Document Released: 06/02/2005 Document Revised: 06/07/2013 Document Reviewed: 04/06/2013 [facility] Patient Information 2015 [place], [place]. This information is not intended to replace advice given to you by your health care provider. Make sure you discuss any questions you have with your health care provider.

## 2014-04-18 ENCOUNTER — Ambulatory Visit (INDEPENDENT_AMBULATORY_CARE_PROVIDER_SITE_OTHER): Payer: Medicare Other

## 2014-04-18 ENCOUNTER — Ambulatory Visit: Payer: Medicare Other

## 2014-04-18 DIAGNOSIS — Z23 Encounter for immunization: Secondary | ICD-10-CM

## 2014-08-24 DIAGNOSIS — Z08 Encounter for follow-up examination after completed treatment for malignant neoplasm: Secondary | ICD-10-CM | POA: Diagnosis not present

## 2014-08-24 DIAGNOSIS — L820 Inflamed seborrheic keratosis: Secondary | ICD-10-CM | POA: Diagnosis not present

## 2014-08-24 DIAGNOSIS — D225 Melanocytic nevi of trunk: Secondary | ICD-10-CM | POA: Diagnosis not present

## 2014-08-24 DIAGNOSIS — L814 Other melanin hyperpigmentation: Secondary | ICD-10-CM | POA: Diagnosis not present

## 2014-08-24 DIAGNOSIS — Z85820 Personal history of malignant melanoma of skin: Secondary | ICD-10-CM | POA: Diagnosis not present

## 2015-01-23 ENCOUNTER — Telehealth: Payer: Self-pay

## 2015-01-23 NOTE — Telephone Encounter (Signed)
Called to schedule Medicare Wellness Visit with Health Coach.  Left a message for call back.  

## 2015-01-25 DIAGNOSIS — L255 Unspecified contact dermatitis due to plants, except food: Secondary | ICD-10-CM | POA: Diagnosis not present

## 2015-02-01 NOTE — Telephone Encounter (Signed)
Appointment scheduled.

## 2015-02-12 ENCOUNTER — Ambulatory Visit (INDEPENDENT_AMBULATORY_CARE_PROVIDER_SITE_OTHER): Payer: Medicare Other

## 2015-02-12 VITALS — BP 140/70 | HR 57 | Ht 70.5 in | Wt 192.0 lb

## 2015-02-12 DIAGNOSIS — Z0000 Encounter for general adult medical examination without abnormal findings: Secondary | ICD-10-CM

## 2015-02-12 NOTE — Progress Notes (Signed)
Pre visit review using our clinic review tool, if applicable. No additional management support is needed unless otherwise documented below in the visit note. 

## 2015-02-12 NOTE — Progress Notes (Signed)
Subjective:   Jeffrey Dodson is a 69 y.o. male who presents for Medicare Annual/Subsequent preventive examination.  Review of Systems:  NO ROS  Sleep patterns:   Sleeps approximately 8 hours per night/does not wake up during the night.    Home Safety/Smoke Alarms:  Lives at home with wife, daughter, and granddaughters.  Smoke detectors present.  Alarm system present.   Firearm Safety:  Firearm kept in safe place.   Seat Belt Safety/Bike Helmet:  Always wears seat belt.   Sun exposure:  Constantly in sun due to job.  Wear sunblock and protective gear.  Follows with Dr. Allyson Sabal, dermotologist.    Counseling:   Eye Exam-Last eye exam was when patient was 69 yrs old.  Advised to schedule an appointment.    Dental- Discussed.  Fear of dentist.   Male:  CCS-UTD   PSA-  12/28/13- 1.12 Hepatitis Risk AssessmentTool-  Reviewed.   Yes only to born between 19-1963. Immunization- Tetanus/tdap, Shingles, and Flu vaccine DUE.  Pt would like to postpone at this time.      Objective:    Vitals: BP 140/70 mmHg  Pulse 57  Ht 5' 10.5" (1.791 m)  Wt 192 lb (87.091 kg)  BMI 27.15 kg/m2  SpO2 97%  Tobacco History  Smoking status  . Never Smoker   Smokeless tobacco  . Not on file     Counseling given: Yes   Past Medical History  Diagnosis Date  . Other chest pain   . Hypercholesterolemia   . Hiatal hernia   . GERD (gastroesophageal reflux disease)   . Diverticulitis of colon   . Hx of urethral stricture   . Degenerative joint disease   . Personal history of skin cancer    Past Surgical History  Procedure Laterality Date  . S/p urethrotomy  11/05    Dr Rosana Hoes   Family History  Problem Relation Age of Onset  . Deep vein thrombosis Father 2    Deceased  . Other Father     PTE  . Hypertension Mother   . Congestive Heart Failure    . Diabetes Mellitus II    . Cirrhosis Brother     Liver, deceased  . Stroke Sister   . Diabetes Sister   . COPD Sister    History  Sexual  Activity  . Sexual Activity: Not on file    Outpatient Encounter Prescriptions as of 02/12/2015  Medication Sig  . aspirin 81 MG tablet Take 81 mg by mouth daily.  Marland Kitchen ibuprofen (ADVIL,MOTRIN) 200 MG tablet Take 200 mg by mouth daily as needed for mild pain.   Marland Kitchen KRILL OIL PO Take by mouth.  . Omega-3 Fatty Acids (FISH OIL PO) Take by mouth. Omega Xl  . ranitidine (ZANTAC) 150 MG capsule Take 150 mg by mouth daily as needed for heartburn.   No facility-administered encounter medications on file as of 02/12/2015.    Activities of Daily Living In your present state of health, do you have any difficulty performing the following activities: 02/12/2015  Hearing? N  Vision? Y  Difficulty concentrating or making decisions? N  Walking or climbing stairs? N  Dressing or bathing? N  Doing errands, shopping? N  Preparing Food and eating ? N  Using the Toilet? N  In the past six months, have you accidently leaked urine? N  Do you have problems with loss of bowel control? N  Managing your Medications? N  Managing your Finances? N  Housekeeping or  managing your Housekeeping? N    Patient Care Team: Brunetta Jeans, PA-C as PCP - General (Physician Assistant) Druscilla Brownie, MD as Consulting Physician (Dermatology)   Assessment:  Hyperlipidemia- Taking Krill oil and Omega 3.  Last Lipid panel- 12/12/13.  Follow up appointment scheduled with PCP.     Osteoarthritis- Pt states since  Taking Krill oil he has not had any flare ups.  Takes ibuprofen only as needed.      Exercise Activities and Dietary recommendations Current Exercise Habits:: The patient has a physically strenous job, but has no regular exercise apart from work.   Diet:  Wife prepares meals.  Eats 2 times per day. Breakfast-small; handful of Cheerios. Lunch and dinner largest meals.    Goals    . Schedule an eye exam.        Fall Risk Fall Risk  02/12/2015 02/01/2014  Falls in the past year? No No   Depression  Screen PHQ 2/9 Scores 02/12/2015 02/01/2014  PHQ - 2 Score 0 0    Cognitive Testing MMSE - Mini Mental State Exam 02/12/2015  Orientation to time 5  Orientation to Place 5  Registration 3  Attention/ Calculation 5  Recall 3  Language- name 2 objects 2  Language- repeat 1  Language- follow 3 step command 3  Language- read & follow direction 1  Write a sentence 1  Copy design 1  Total score 30    Immunization History  Administered Date(s) Administered  . H1N1 06/20/2008  . Influenza Whole 04/19/2009  . Influenza,inj,Quad PF,36+ Mos 04/18/2014  . Pneumococcal Conjugate-13 12/28/2013   Screening Tests Health Maintenance  Topic Date Due  . INFLUENZA VACCINE  02/19/2015 (Originally 01/15/2015)  . ZOSTAVAX  02/19/2015 (Originally 02/03/2008)  . TETANUS/TDAP  02/19/2015 (Originally 02/03/1965)  . Hepatitis C Screening  02/19/2015 (Originally 13-Aug-1945)  . PNA vac Low Risk Adult (2 of 2 - PPSV23) 05/17/2015 (Originally 12/29/2014)  . COLONOSCOPY  01/14/2017    Plan:  Follow up with Einar Pheasant as scheduled for physical exam.    Schedule eye exam.    Check with insurance regarding immunizations-tdap/td and shingles.     During the course of the visit the patient was educated and counseled about the following appropriate screening and preventive services:   Vaccines to include Pneumoccal, Influenza, Hepatitis B, Td, Zostavax, HCV  Electrocardiogram  Cardiovascular Disease  Colorectal cancer screening  Diabetes screening  Prostate Cancer Screening  Glaucoma screening  Nutrition counseling   Smoking cessation counseling  Patient Instructions (the written plan) was given to the patient.    Rudene Anda, RN  02/12/2015

## 2015-02-12 NOTE — Patient Instructions (Addendum)
Follow up with Jeffrey Dodson as scheduled for physical exam.    Schedule eye exam.    Check with insurance regarding immunizations-tdap/td and shingles.     Fat and Cholesterol Control Diet Fat and cholesterol levels in your blood and organs are influenced by your diet. High levels of fat and cholesterol may lead to diseases of the heart, small and large blood vessels, gallbladder, liver, and pancreas. CONTROLLING FAT AND CHOLESTEROL WITH DIET Although exercise and lifestyle factors are important, your diet is key. That is because certain foods are known to raise cholesterol and others to lower it. The goal is to balance foods for their effect on cholesterol and more importantly, to replace saturated and trans fat with other types of fat, such as monounsaturated fat, polyunsaturated fat, and omega-3 fatty acids. On average, a person should consume no more than 15 to 17 g of saturated fat daily. Saturated and trans fats are considered "bad" fats, and they will raise LDL cholesterol. Saturated fats are primarily found in animal products such as meats, butter, and cream. However, that does not mean you need to give up all your favorite foods. Today, there are good tasting, low-fat, low-cholesterol substitutes for most of the things you like to eat. Choose low-fat or nonfat alternatives. Choose round or loin cuts of red meat. These types of cuts are lowest in fat and cholesterol. Chicken (without the skin), fish, veal, and ground [place] breast are great choices. Eliminate fatty meats, such as hot dogs and salami. Even shellfish have little or no saturated fat. Have a 3 oz (85 g) portion when you eat lean meat, poultry, or fish. Trans fats are also called "partially hydrogenated oils." They are oils that have been scientifically manipulated so that they are solid at room temperature resulting in a longer shelf life and improved taste and texture of foods in which they are added. Trans fats are found in stick  margarine, some tub margarines, cookies, crackers, and baked goods.  When baking and cooking, oils are a great substitute for butter. The monounsaturated oils are especially beneficial since it is believed they lower LDL and raise HDL. The oils you should avoid entirely are saturated tropical oils, such as coconut and palm.  Remember to eat a lot from food groups that are naturally free of saturated and trans fat, including fish, fruit, vegetables, beans, grains (barley, rice, couscous, bulgur wheat), and pasta (without cream sauces).  IDENTIFYING FOODS THAT LOWER FAT AND CHOLESTEROL  Soluble fiber may lower your cholesterol. This type of fiber is found in fruits such as apples, vegetables such as broccoli, potatoes, and carrots, legumes such as beans, peas, and lentils, and grains such as barley. Foods fortified with plant sterols (phytosterol) may also lower cholesterol. You should eat at least 2 g per day of these foods for a cholesterol lowering effect.  Read package labels to identify low-saturated fats, trans fat free, and low-fat foods at the supermarket. Select cheeses that have only 2 to 3 g saturated fat per ounce. Use a heart-healthy tub margarine that is free of trans fats or partially hydrogenated oil. When buying baked goods (cookies, crackers), avoid partially hydrogenated oils. Breads and muffins should be made from whole grains (whole-wheat or whole oat flour, instead of "flour" or "enriched flour"). Buy non-creamy canned soups with reduced salt and no added fats.  FOOD PREPARATION TECHNIQUES  Never deep-fry. If you must fry, either stir-fry, which uses very little fat, or use non-stick cooking sprays. When possible, broil,  bake, or roast meats, and steam vegetables. Instead of putting butter or margarine on vegetables, use lemon and herbs, applesauce, and cinnamon (for squash and sweet potatoes). Use nonfat yogurt, salsa, and low-fat dressings for salads.  LOW-SATURATED FAT / LOW-FAT FOOD  SUBSTITUTES Meats / Saturated Fat (g)  Avoid: Steak, marbled (3 oz/85 g) / 11 g  Choose: Steak, lean (3 oz/85 g) / 4 g  Avoid: Hamburger (3 oz/85 g) / 7 g  Choose: Hamburger, lean (3 oz/85 g) / 5 g  Avoid: Ham (3 oz/85 g) / 6 g  Choose: Ham, lean cut (3 oz/85 g) / 2.4 g  Avoid: Chicken, with skin, dark meat (3 oz/85 g) / 4 g  Choose: Chicken, skin removed, dark meat (3 oz/85 g) / 2 g  Avoid: Chicken, with skin, light meat (3 oz/85 g) / 2.5 g  Choose: Chicken, skin removed, light meat (3 oz/85 g) / 1 g Dairy / Saturated Fat (g)  Avoid: Whole milk (1 cup) / 5 g  Choose: Low-fat milk, 2% (1 cup) / 3 g  Choose: Low-fat milk, 1% (1 cup) / 1.5 g  Choose: Skim milk (1 cup) / 0.3 g  Avoid: Hard cheese (1 oz/28 g) / 6 g  Choose: Skim milk cheese (1 oz/28 g) / 2 to 3 g  Avoid: Cottage cheese, 4% fat (1 cup) / 6.5 g  Choose: Low-fat cottage cheese, 1% fat (1 cup) / 1.5 g  Avoid: Ice cream (1 cup) / 9 g  Choose: Sherbet (1 cup) / 2.5 g  Choose: Nonfat frozen yogurt (1 cup) / 0.3 g  Choose: Frozen fruit bar / trace  Avoid: Whipped cream (1 tbs) / 3.5 g  Choose: Nondairy whipped topping (1 tbs) / 1 g Condiments / Saturated Fat (g)  Avoid: Mayonnaise (1 tbs) / 2 g  Choose: Low-fat mayonnaise (1 tbs) / 1 g  Avoid: Butter (1 tbs) / 7 g  Choose: Extra light margarine (1 tbs) / 1 g  Avoid: Coconut oil (1 tbs) / 11.8 g  Choose: Olive oil (1 tbs) / 1.8 g  Choose: Corn oil (1 tbs) / 1.7 g  Choose: Safflower oil (1 tbs) / 1.2 g  Choose: Sunflower oil (1 tbs) / 1.4 g  Choose: Soybean oil (1 tbs) / 2.4 g  Choose: Canola oil (1 tbs) / 1 g Document Released: 06/02/2005 Document Revised: 09/27/2012 Document Reviewed: 08/31/2013 [employer] Patient Information 2015 [place]. This information is not intended to replace advice given to you by your health care provider. Make sure you discuss any questions you have with your health care provider.  Health Maintenance A  healthy lifestyle and preventative care can promote health and wellness.  Maintain regular health, dental, and eye exams.  Eat a healthy diet. Foods like vegetables, fruits, whole grains, low-fat dairy products, and lean protein foods contain the nutrients you need and are low in calories. Decrease your intake of foods high in solid fats, added sugars, and salt. Get information about a proper diet from your health care provider, if necessary.  Regular physical exercise is one of the most important things you can do for your health. Most adults should get at least 150 minutes of moderate-intensity exercise (any activity that increases your heart rate and causes you to sweat) each week. In addition, most adults need muscle-strengthening exercises on 2 or more days a week.   Maintain a healthy weight. The body mass index (BMI) is a screening tool to identify possible  weight problems. It provides an estimate of body fat based on height and weight. Your health care provider can find your BMI and can help you achieve or maintain a healthy weight. For males 20 years and older:  A BMI below 18.5 is considered underweight.  A BMI of 18.5 to 24.9 is normal.  A BMI of 25 to 29.9 is considered overweight.  A BMI of 30 and above is considered obese.  Maintain normal blood lipids and cholesterol by exercising and minimizing your intake of saturated fat. Eat a balanced diet with plenty of fruits and vegetables. Blood tests for lipids and cholesterol should begin at age 22 and be repeated every 5 years. If your lipid or cholesterol levels are high, you are over age 43, or you are at high risk for heart disease, you may need your cholesterol levels checked more frequently.Ongoing high lipid and cholesterol levels should be treated with medicines if diet and exercise are not working.  If you smoke, find out from your health care provider how to quit. If you do not use tobacco, do not start.  Lung cancer  screening is recommended for adults aged 30-80 years who are at high risk for developing lung cancer because of a history of smoking. A yearly low-dose CT scan of the lungs is recommended for people who have at least a 30-pack-year history of smoking and are current smokers or have quit within the past 15 years. A pack year of smoking is smoking an average of 1 pack of cigarettes a day for 1 year (for example, a 30-pack-year history of smoking could mean smoking 1 pack a day for 30 years or 2 packs a day for 15 years). Yearly screening should continue until the smoker has stopped smoking for at least 15 years. Yearly screening should be stopped for people who develop a health problem that would prevent them from having lung cancer treatment.  If you choose to drink alcohol, do not have more than 2 drinks per day. One drink is considered to be 12 oz (360 mL) of beer, 5 oz (150 mL) of wine, or 1.5 oz (45 mL) of liquor.  Avoid the use of street drugs. Do not share needles with anyone. Ask for help if you need support or instructions about stopping the use of drugs.  High blood pressure causes heart disease and increases the risk of stroke. Blood pressure should be checked at least every 1-2 years. Ongoing high blood pressure should be treated with medicines if weight loss and exercise are not effective.  If you are 55-90 years old, ask your health care provider if you should take aspirin to prevent heart disease.  Diabetes screening involves taking a blood sample to check your fasting blood sugar level. This should be done once every 3 years after age 70 if you are at a normal weight and without risk factors for diabetes. Testing should be considered at a younger age or be carried out more frequently if you are overweight and have at least 1 risk factor for diabetes.  Colorectal cancer can be detected and often prevented. Most routine colorectal cancer screening begins at the age of 52 and continues through  age 110. However, your health care provider may recommend screening at an earlier age if you have risk factors for colon cancer. On a yearly basis, your health care provider may provide home test kits to check for hidden blood in the stool. A small camera at the end of a  tube may be used to directly examine the colon (sigmoidoscopy or colonoscopy) to detect the earliest forms of colorectal cancer. Talk to your health care provider about this at age 99 when routine screening begins. A direct exam of the colon should be repeated every 5-10 years through age 52, unless early forms of precancerous polyps or small growths are found.  People who are at an increased risk for hepatitis B should be screened for this virus. You are considered at high risk for hepatitis B if:  You were born in a country where hepatitis B occurs often. Talk with your health care provider about which countries are considered high risk.  Your parents were born in a high-risk country and you have not received a shot to protect against hepatitis B (hepatitis B vaccine).  You have HIV or AIDS.  You use needles to inject street drugs.  You live with, or have sex with, someone who has hepatitis B.  You are a man who has sex with other men (MSM).  You get hemodialysis treatment.  You take certain medicines for conditions like cancer, organ transplantation, and autoimmune conditions.  Hepatitis C blood testing is recommended for all people born from [birth year] through 1963 and any individual with known risk factors for hepatitis C.  Healthy men should no longer receive prostate-specific antigen (PSA) blood tests as part of routine cancer screening. Talk to your health care provider about prostate cancer screening.  Testicular cancer screening is not recommended for adolescents or adult males who have no symptoms. Screening includes self-exam, a health care provider exam, and other screening tests. Consult with your health care provider  about any symptoms you have or any concerns you have about testicular cancer.  Practice safe sex. Use condoms and avoid high-risk sexual practices to reduce the spread of sexually transmitted infections (STIs).  You should be screened for STIs, including gonorrhea and chlamydia if:  You are sexually active and are younger than 24 years.  You are older than 24 years, and your health care provider tells you that you are at risk for this type of infection.  Your sexual activity has changed since you were last screened, and you are at an increased risk for chlamydia or gonorrhea. Ask your health care provider if you are at risk.  If you are at risk of being infected with HIV, it is recommended that you take a prescription medicine daily to prevent HIV infection. This is called pre-exposure prophylaxis (PrEP). You are considered at risk if:  You are a man who has sex with other men (MSM).  You are a heterosexual man who is sexually active with multiple partners.  You take drugs by injection.  You are sexually active with a partner who has HIV.  Talk with your health care provider about whether you are at high risk of being infected with HIV. If you choose to begin PrEP, you should first be tested for HIV. You should then be tested every 3 months for as long as you are taking PrEP.  Use sunscreen. Apply sunscreen liberally and repeatedly throughout the day. You should seek shade when your shadow is shorter than you. Protect yourself by wearing long sleeves, pants, a wide-brimmed hat, and sunglasses year round whenever you are outdoors.  Tell your health care provider of new moles or changes in moles, especially if there is a change in shape or color. Also, tell your health care provider if a mole is larger than the size of  a pencil eraser.  A one-time screening for abdominal aortic aneurysm (AAA) and surgical repair of large AAAs by ultrasound is recommended for men aged 96-75 years who are  current or former smokers.  Stay current with your vaccines (immunizations). Document Released: 11/29/2007 Document Revised: 06/07/2013 Document Reviewed: 10/28/2010 [facility] Patient Information 2015 [place], [place]. This information is not intended to replace advice given to you by your health care provider. Make sure you discuss any questions you have with your health care provider.

## 2015-02-12 NOTE — Telephone Encounter (Signed)
Appointment scheduled.

## 2015-02-14 ENCOUNTER — Telehealth: Payer: Self-pay | Admitting: Physician Assistant

## 2015-02-14 NOTE — Telephone Encounter (Signed)
pre visit letter mailed 02/14/15

## 2015-02-28 ENCOUNTER — Encounter: Payer: Medicare Other | Admitting: Physician Assistant

## 2015-05-29 ENCOUNTER — Telehealth: Payer: Self-pay | Admitting: Physician Assistant

## 2015-05-29 NOTE — Telephone Encounter (Signed)
Flu shot record.//AB/CMA

## 2015-05-29 NOTE — Telephone Encounter (Signed)
Pt had flu shot at [facility] 05/23/15

## 2015-05-29 NOTE — Telephone Encounter (Signed)
LM for pt to call and schedule flu shot or update records. °

## 2015-05-30 DIAGNOSIS — L821 Other seborrheic keratosis: Secondary | ICD-10-CM | POA: Diagnosis not present

## 2015-05-30 DIAGNOSIS — L812 Freckles: Secondary | ICD-10-CM | POA: Diagnosis not present

## 2015-05-30 DIAGNOSIS — Z85820 Personal history of malignant melanoma of skin: Secondary | ICD-10-CM | POA: Diagnosis not present

## 2015-05-30 DIAGNOSIS — L720 Epidermal cyst: Secondary | ICD-10-CM | POA: Diagnosis not present

## 2015-11-19 DIAGNOSIS — J0100 Acute maxillary sinusitis, unspecified: Secondary | ICD-10-CM | POA: Diagnosis not present

## 2015-11-29 DIAGNOSIS — L821 Other seborrheic keratosis: Secondary | ICD-10-CM | POA: Diagnosis not present

## 2015-11-29 DIAGNOSIS — Z85820 Personal history of malignant melanoma of skin: Secondary | ICD-10-CM | POA: Diagnosis not present

## 2015-11-29 DIAGNOSIS — D225 Melanocytic nevi of trunk: Secondary | ICD-10-CM | POA: Diagnosis not present

## 2015-11-29 DIAGNOSIS — D1801 Hemangioma of skin and subcutaneous tissue: Secondary | ICD-10-CM | POA: Diagnosis not present

## 2016-03-28 ENCOUNTER — Telehealth: Payer: Self-pay | Admitting: Physician Assistant

## 2016-03-28 NOTE — Telephone Encounter (Signed)
Patient would like to request a transfer to Dr. [name]. Please advise.   Patient phone: [phone]

## 2016-03-31 NOTE — Telephone Encounter (Signed)
OK TY

## 2016-04-08 NOTE — Telephone Encounter (Signed)
Patient aware.

## 2016-05-29 DIAGNOSIS — L821 Other seborrheic keratosis: Secondary | ICD-10-CM | POA: Diagnosis not present

## 2016-05-29 DIAGNOSIS — D225 Melanocytic nevi of trunk: Secondary | ICD-10-CM | POA: Diagnosis not present

## 2016-05-29 DIAGNOSIS — L814 Other melanin hyperpigmentation: Secondary | ICD-10-CM | POA: Diagnosis not present

## 2016-05-29 DIAGNOSIS — Z85820 Personal history of malignant melanoma of skin: Secondary | ICD-10-CM | POA: Diagnosis not present

## 2016-05-29 DIAGNOSIS — L219 Seborrheic dermatitis, unspecified: Secondary | ICD-10-CM | POA: Diagnosis not present

## 2016-06-13 ENCOUNTER — Encounter: Payer: Self-pay | Admitting: Pulmonary Disease

## 2016-06-24 ENCOUNTER — Telehealth: Payer: Self-pay | Admitting: Behavioral Health

## 2016-06-24 ENCOUNTER — Encounter: Payer: Self-pay | Admitting: Behavioral Health

## 2016-06-24 NOTE — Telephone Encounter (Signed)
Pre-Visit Call completed with patient and chart updated.   Pre-Visit Info documented in Specialty Comments under [employer].    

## 2016-06-25 ENCOUNTER — Ambulatory Visit (INDEPENDENT_AMBULATORY_CARE_PROVIDER_SITE_OTHER): Payer: Medicare Other | Admitting: Family Medicine

## 2016-06-25 ENCOUNTER — Encounter: Payer: Self-pay | Admitting: Family Medicine

## 2016-06-25 ENCOUNTER — Telehealth: Payer: Self-pay | Admitting: Physician Assistant

## 2016-06-25 VITALS — BP 128/76 | HR 60 | Temp 98.1°F | Ht 71.0 in | Wt 200.4 lb

## 2016-06-25 DIAGNOSIS — Z87898 Personal history of other specified conditions: Secondary | ICD-10-CM | POA: Diagnosis not present

## 2016-06-25 DIAGNOSIS — Z125 Encounter for screening for malignant neoplasm of prostate: Secondary | ICD-10-CM | POA: Diagnosis not present

## 2016-06-25 DIAGNOSIS — E663 Overweight: Secondary | ICD-10-CM

## 2016-06-25 DIAGNOSIS — E785 Hyperlipidemia, unspecified: Secondary | ICD-10-CM

## 2016-06-25 LAB — LIPID PANEL
Cholesterol: 190 mg/dL (ref 0–200)
HDL: 37.1 mg/dL — ABNORMAL LOW (ref >39.00)
NonHDL: 153.39
Total CHOL/HDL Ratio: 5
Triglycerides: 393 mg/dL — ABNORMAL HIGH (ref 0.0–149.0)
VLDL: 78.6 mg/dL — ABNORMAL HIGH (ref 0.0–40.0)

## 2016-06-25 LAB — COMPREHENSIVE METABOLIC PANEL
ALBUMIN: 4.5 g/dL (ref 3.5–5.2)
ALT: 23 U/L (ref 0–53)
AST: 23 U/L (ref 0–37)
Alkaline Phosphatase: 61 U/L (ref 39–117)
BUN: 17 mg/dL (ref 6–23)
CO2: 26 mEq/L (ref 19–32)
Calcium: 9.5 mg/dL (ref 8.4–10.5)
Chloride: 105 mEq/L (ref 96–112)
Creatinine, Ser: 1.16 mg/dL (ref 0.40–1.50)
GFR: 66.47 mL/min (ref >60.00)
Glucose, Bld: 129 mg/dL — ABNORMAL HIGH (ref 70–99)
Potassium: 4.2 mEq/L (ref 3.5–5.1)
SODIUM: 140 meq/L (ref 135–145)
Total Bilirubin: 0.7 mg/dL (ref 0.2–1.2)
Total Protein: 7 g/dL (ref 6.0–8.3)

## 2016-06-25 LAB — PSA: PSA: 1.53 ng/mL (ref 0.10–4.00)

## 2016-06-25 LAB — HEMOGLOBIN A1C: HEMOGLOBIN A1C: 6.1 % (ref 4.6–6.5)

## 2016-06-25 LAB — LDL CHOLESTEROL, DIRECT: Direct LDL: 73 mg/dL

## 2016-06-25 NOTE — Progress Notes (Signed)
Pre visit review using our clinic review tool, if applicable. No additional management support is needed unless otherwise documented below in the visit note. 

## 2016-06-25 NOTE — Telephone Encounter (Signed)
Pt's spouse Juliann Pulse called in she says that she can recall pt having his tetanus shot about 6 years ago at an urgent care. She says that she would like to make provider aware.

## 2016-06-25 NOTE — Progress Notes (Signed)
Chief Complaint  Patient presents with  . Establish Care    Transfer of Care.     Subjective: Patient is a 71 y.o. male here for transferring care.  He is largely healthy and takes mainly supplements and as needed ranitidine. He has hx of elevated cholesterol- was reportedly improved once he started taking Krill oil. He is active with work and is adhering to a healthy diet. He does not smoke. He did eat today.  ROS: Heart: Denies chest pain  Family History  Problem Relation Age of Onset  . Deep vein thrombosis Father 7    Deceased  . Other Father     PTE  . Hypertension Mother   . Congestive Heart Failure    . Diabetes Mellitus II    . Cirrhosis Brother     Liver, deceased  . Stroke Sister   . Diabetes Sister   . COPD Sister    Past Medical History:  Diagnosis Date  . Degenerative joint disease   . GERD (gastroesophageal reflux disease)   . Hiatal hernia   . Hx of urethral stricture   . Hypercholesterolemia   . Personal history of skin cancer    No Known Allergies  Current Outpatient Prescriptions:  .  aspirin 81 MG tablet, Take 81 mg by mouth daily., Disp: , Rfl:  .  ibuprofen (ADVIL,MOTRIN) 200 MG tablet, Take 200 mg by mouth daily as needed for mild pain. , Disp: , Rfl:  .  KRILL OIL PO, Take by mouth., Disp: , Rfl:  .  ranitidine (ZANTAC) 150 MG capsule, Take 150 mg by mouth daily as needed for heartburn., Disp: , Rfl:   Objective: BP 128/76 (BP Location: Left Arm, Patient Position: Sitting, Cuff Size: Normal)   Pulse 60   Temp 98.1 F (36.7 C) (Oral)   Ht 5\' 11"  (1.803 m)   Wt 200 lb 6.4 oz (90.9 kg)   SpO2 98% Comment: RA  BMI 27.95 kg/m  General: Awake, appears stated age HEENT: MMM, EOMi Heart: RRR, no murmurs, no bruits, no lower extremity edema Lungs: CTAB, no rales, wheezes or rhonchi. No accessory muscle use Psych: Age appropriate judgment and insight, normal affect and mood  Assessment and Plan: Hyperlipidemia, unspecified hyperlipidemia  type - Plan: Lipid panel  History of prediabetes - Plan: Hemoglobin A1c  Overweight (BMI 25.0-29.9) - Plan: Comprehensive metabolic panel  Screening for prostate cancer - Plan: PSA  Orders as above. His labs look normal, I will see him in one year for a complete physical. I would have him schedule in 6 months for an AWV with our nursing team. If labs are abnormal, I will have him in sooner. Of note, he may be due for a colonoscopy, but will check his records. He had one done when he was 59 or 9 (his 1st one) and was recommended to f/u in 10 years.  The patient voiced understanding and agreement to the plan.  [place], DO 06/25/16  12:05 PM

## 2016-06-25 NOTE — Patient Instructions (Signed)
Stay active and keep up the good work.

## 2016-06-26 ENCOUNTER — Telehealth: Payer: Self-pay | Admitting: *Deleted

## 2016-06-26 DIAGNOSIS — E785 Hyperlipidemia, unspecified: Secondary | ICD-10-CM

## 2016-06-26 NOTE — Telephone Encounter (Signed)
Called and spoke with the pt and informed him of recent lab result and note.  Pt verbalized understanding and agreed.  Pt was scheduled for lab appt on (Thurs.-07/03/16 @ 9:00am) and appt with the doctor on (Thurs-07/24/16 @ 10:00am).  Future lab ordered and sent.//AB/CMA

## 2016-06-26 NOTE — Telephone Encounter (Addendum)
Let pt know his cholesterol was a little high and his sugar is a little high- no diabetes though; all other labs are unremarkable.. I would like him to recheck this when he is fasting in the next 1-2 weeks at his earliest convenience. Schedule him with me in 4 weeks to discuss his labs. TY.

## 2016-06-26 NOTE — Telephone Encounter (Signed)
Tetanus shot was record.//AB/CMA

## 2016-07-03 ENCOUNTER — Other Ambulatory Visit: Payer: Medicare Other

## 2016-07-07 ENCOUNTER — Other Ambulatory Visit: Payer: Medicare Other

## 2016-07-10 ENCOUNTER — Telehealth: Payer: Self-pay | Admitting: Family Medicine

## 2016-07-10 ENCOUNTER — Other Ambulatory Visit (INDEPENDENT_AMBULATORY_CARE_PROVIDER_SITE_OTHER): Payer: Medicare Other

## 2016-07-10 DIAGNOSIS — E785 Hyperlipidemia, unspecified: Secondary | ICD-10-CM

## 2016-07-10 LAB — LIPID PANEL
Cholesterol: 165 mg/dL (ref 0–200)
HDL: 35.5 mg/dL — ABNORMAL LOW (ref >39.00)
LDL CALC: 94 mg/dL (ref 0–99)
NonHDL: 129.25
TRIGLYCERIDES: 175 mg/dL — AB (ref 0.0–149.0)
Total CHOL/HDL Ratio: 5
VLDL: 35 mg/dL (ref 0.0–40.0)

## 2016-07-10 NOTE — Telephone Encounter (Signed)
[employer] called in. They said that they are not showing Dr. [name] as being in network with pt's insurance. However, he did show Dr. Lorelei Pont and Percell Miller as being in network. He said that he will call us back tomorrow to update our office so that we will know if pt need to be switched providers.

## 2016-07-10 NOTE — Telephone Encounter (Signed)
Called patient to let him know we have reached out to see to credentialing department to verify will call back once I hear back.

## 2016-07-11 NOTE — Telephone Encounter (Signed)
Patient's wife called regarding this issue today. She stated that the patient does not want to change physicians at all. I let the patient's wife know that this situation has been sent to the credentialing department on our side and we are in the process of working on it. She stated that [facility] is supposed to call her on Monday to verify if the situation has been resolved. I informed her it may take a little longer than that to get this resolved. She stated she would let [facility] know that we are working on it here and to call back at the end of next week. She requests that once we hear something if we could please notify her. She was very agreeable but stated that if it cannot be worked out the patient will return to seeing Elyn Aquas in [place].  Phone: [phone]

## 2016-07-11 NOTE — Telephone Encounter (Signed)
noted 

## 2016-07-18 ENCOUNTER — Telehealth: Payer: Self-pay | Admitting: *Deleted

## 2016-07-18 NOTE — Telephone Encounter (Signed)
Called patient and left message to return call

## 2016-07-23 NOTE — Telephone Encounter (Signed)
Pt's spouse called in to follow up. Pt has an appt tomorrow. She would like to be advised on if credentials has been updated? Pt's spouse cancelled the follow up appt because they dont want to receive a bill. She said that they will reschedule as soon as credentials has been updated.

## 2016-07-24 ENCOUNTER — Ambulatory Visit: Payer: Medicare Other | Admitting: Family Medicine

## 2016-08-06 ENCOUNTER — Telehealth: Payer: Self-pay | Admitting: Family Medicine

## 2016-08-06 NOTE — Telephone Encounter (Signed)
Please mail the patients last lab results form 1/10 and 1/18. Patient can not come in because he is having issues with his insurance. Please mail to: [address] [place], [place]

## 2016-08-06 NOTE — Telephone Encounter (Signed)
Letter has been mailed to patient with lab results. TL/CMA

## 2016-12-18 NOTE — Progress Notes (Deleted)
Subjective:   Jeffrey Dodson is a 71 y.o. male who presents for Medicare Annual/Subsequent preventive examination.  Review of Systems:  No ROS.  Medicare Wellness Visit. Additional risk factors are reflected in the social history.    Sleep patterns:   Home Safety/Smoke Alarms: Feels safe in home. Smoke alarms in place.  Living environment; residence and Firearm Safety: [place] Safety/Bike Helmet: Wears seat belt.   Counseling:   Eye Exam-  Dental-  Male:   CCS-     PSA-  Lab Results  Component Value Date   PSA 1.53 06/25/2016   PSA 1.12 12/28/2013   PSA 0.84 02/17/2008       Objective:    Vitals: There were no vitals taken for this visit.  There is no height or weight on file to calculate BMI.  Tobacco History  Smoking Status  . Never Smoker  Smokeless Tobacco  . Not on file     Counseling given: Not Answered   Past Medical History:  Diagnosis Date  . Degenerative joint disease   . GERD (gastroesophageal reflux disease)   . Hiatal hernia   . Hx of urethral stricture   . Hypercholesterolemia   . Personal history of skin cancer    Past Surgical History:  Procedure Laterality Date  . S/P urethrotomy  11/05   Dr Rosana Hoes   Family History  Problem Relation Age of Onset  . Deep vein thrombosis Father 22       Deceased  . Other Father        PTE  . Hypertension Mother   . Congestive Heart Failure Unknown   . Diabetes Mellitus II Unknown   . Cirrhosis Brother        Liver, deceased  . Stroke Sister   . Diabetes Sister   . COPD Sister    History  Sexual Activity  . Sexual activity: Not on file    Outpatient Encounter Prescriptions as of 12/24/2016  Medication Sig  . aspirin 81 MG tablet Take 81 mg by mouth daily.  Marland Kitchen ibuprofen (ADVIL,MOTRIN) 200 MG tablet Take 200 mg by mouth daily as needed for mild pain.   Marland Kitchen KRILL OIL PO Take by mouth.  . ranitidine (ZANTAC) 150 MG capsule Take 150 mg by mouth daily as needed for heartburn.   No  facility-administered encounter medications on file as of 12/24/2016.     Activities of Daily Living No flowsheet data found.  Patient Care Team: Shelda Pal, DO as PCP - General (Family Medicine) Druscilla Brownie, MD as Consulting Physician (Dermatology)   Assessment:    Physical assessment deferred to PCP.  Exercise Activities and Dietary recommendations   Diet (meal preparation, eat out, water intake, caffeinated beverages, dairy products, fruits and vegetables): {Desc; diets:16563} Breakfast: Lunch:  Dinner:      Goals      Patient Stated   . Schedule an eye exam.   (pt-stated)      Fall Risk Fall Risk  02/12/2015 02/01/2014  Falls in the past year? No No   Depression Screen PHQ 2/9 Scores 02/12/2015 02/01/2014  PHQ - 2 Score 0 0    Cognitive Function MMSE - Mini Mental State Exam 02/12/2015  Orientation to time 5  Orientation to Place 5  Registration 3  Attention/ Calculation 5  Recall 3  Language- name 2 objects 2  Language- repeat 1  Language- follow 3 step command 3  Language- read & follow direction 1  Write a sentence  1  Copy design 1  Total score 30        Immunization History  Administered Date(s) Administered  . H1N1 06/20/2008  . Influenza Whole 04/19/2009  . Influenza,inj,Quad PF,36+ Mos 04/18/2014  . Influenza-Unspecified 05/23/2015, 05/15/2016  . Pneumococcal Conjugate-13 12/28/2013  . Tetanus 06/26/2010   Screening Tests Health Maintenance  Topic Date Due  . Hepatitis C Screening  [birth year]  . PNA vac Low Risk Adult (2 of 2 - PPSV23) 12/29/2014  . INFLUENZA VACCINE  01/14/2017  . COLONOSCOPY  01/14/2017  . TETANUS/TDAP  06/26/2020      Plan:   ***  I have personally reviewed and noted the following in the patient's chart:   . Medical and social history . Use of alcohol, tobacco or illicit drugs  . Current medications and supplements . Functional ability and status . Nutritional status . Physical  activity . Advanced directives . List of other physicians . Hospitalizations, surgeries, and ER visits in previous 12 months . Vitals . Screenings to include cognitive, depression, and falls . Referrals and appointments  In addition, I have reviewed and discussed with patient certain preventive protocols, quality metrics, and best practice recommendations. A written personalized care plan for preventive services as well as general preventive health recommendations were provided to patient.     Shela Nevin, [place]  12/18/2016

## 2016-12-24 ENCOUNTER — Ambulatory Visit: Payer: Medicare Other | Admitting: *Deleted

## 2017-06-29 ENCOUNTER — Encounter: Payer: Medicare Other | Admitting: Family Medicine

## 2018-03-18 ENCOUNTER — Ambulatory Visit (INDEPENDENT_AMBULATORY_CARE_PROVIDER_SITE_OTHER): Payer: Medicare Other | Admitting: Family Medicine

## 2018-03-18 ENCOUNTER — Encounter: Payer: Self-pay | Admitting: Family Medicine

## 2018-03-18 VITALS — BP 130/82 | HR 75 | Temp 98.1°F | Ht 71.0 in | Wt 197.5 lb

## 2018-03-18 DIAGNOSIS — R739 Hyperglycemia, unspecified: Secondary | ICD-10-CM

## 2018-03-18 DIAGNOSIS — Z1211 Encounter for screening for malignant neoplasm of colon: Secondary | ICD-10-CM | POA: Diagnosis not present

## 2018-03-18 DIAGNOSIS — E781 Pure hyperglyceridemia: Secondary | ICD-10-CM | POA: Diagnosis not present

## 2018-03-18 DIAGNOSIS — Z125 Encounter for screening for malignant neoplasm of prostate: Secondary | ICD-10-CM | POA: Diagnosis not present

## 2018-03-18 DIAGNOSIS — Z0000 Encounter for general adult medical examination without abnormal findings: Secondary | ICD-10-CM | POA: Diagnosis not present

## 2018-03-18 DIAGNOSIS — Z1159 Encounter for screening for other viral diseases: Secondary | ICD-10-CM

## 2018-03-18 DIAGNOSIS — Z23 Encounter for immunization: Secondary | ICD-10-CM

## 2018-03-18 LAB — COMPREHENSIVE METABOLIC PANEL
ALT: 33 U/L (ref 0–53)
AST: 29 U/L (ref 0–37)
Albumin: 4.4 g/dL (ref 3.5–5.2)
Alkaline Phosphatase: 68 U/L (ref 39–117)
BUN: 14 mg/dL (ref 6–23)
CHLORIDE: 103 meq/L (ref 96–112)
CO2: 30 meq/L (ref 19–32)
Calcium: 9.7 mg/dL (ref 8.4–10.5)
Creatinine, Ser: 1.22 mg/dL (ref 0.40–1.50)
GFR: 62.39 mL/min (ref >60.00)
GLUCOSE: 169 mg/dL — AB (ref 70–99)
POTASSIUM: 4.1 meq/L (ref 3.5–5.1)
Sodium: 139 mEq/L (ref 135–145)
Total Bilirubin: 0.6 mg/dL (ref 0.2–1.2)
Total Protein: 7.1 g/dL (ref 6.0–8.3)

## 2018-03-18 LAB — LIPID PANEL
CHOL/HDL RATIO: 6
Cholesterol: 197 mg/dL (ref 0–200)
HDL: 35.5 mg/dL — ABNORMAL LOW (ref >39.00)
Triglycerides: 482 mg/dL — ABNORMAL HIGH (ref 0.0–149.0)

## 2018-03-18 LAB — LDL CHOLESTEROL, DIRECT: LDL DIRECT: 83 mg/dL

## 2018-03-18 LAB — PSA: PSA: 1.51 ng/mL (ref 0.10–4.00)

## 2018-03-18 NOTE — Addendum Note (Signed)
Addended by: Hinton Dyer on: 03/18/2018 11:51 AM   Modules accepted: Orders

## 2018-03-18 NOTE — Progress Notes (Signed)
Chief Complaint  Patient presents with  . Annual Exam    Well Male Jeffrey Dodson is here for a complete physical.   His last physical was >1 year ago.  Current diet: in general, a "fine" diet.   Current exercise: will still do some jobs where he is active Weight trend: stable Daytime fatigue? No. Seat belt? Yes.    Health maintenance Shingrix- No Colonoscopy- No Tetanus- Yes Hep C- No   Pneumonia vaccine- No- due for PCV23   Past Medical History:  Diagnosis Date  . Degenerative joint disease   . GERD (gastroesophageal reflux disease)   . Hiatal hernia   . Hx of urethral stricture   . Hypercholesterolemia   . Personal history of skin cancer      Past Surgical History:  Procedure Laterality Date  . S/P urethrotomy  11/05   Dr Rosana Hoes    Medications  Current Outpatient Medications on File Prior to Visit  Medication Sig Dispense Refill  . aspirin 81 MG tablet Take 81 mg by mouth daily.    Marland Kitchen ibuprofen (ADVIL,MOTRIN) 200 MG tablet Take 200 mg by mouth daily as needed for mild pain.     Marland Kitchen KRILL OIL PO Take by mouth.    . ranitidine (ZANTAC) 150 MG capsule Take 150 mg by mouth daily as needed for heartburn.     Allergies No Known Allergies  Family History Family History  Problem Relation Age of Onset  . Deep vein thrombosis Father 39       Deceased  . Other Father        PTE  . Hypertension Mother   . Congestive Heart Failure Unknown   . Diabetes Mellitus II Unknown   . Cirrhosis Brother        Liver, deceased  . Stroke Sister   . Diabetes Sister   . COPD Sister     Review of Systems: Constitutional:  no fevers or chills Eye:  no recent significant change in vision Ear/Nose/Mouth/Throat:  Ears:  no recent hearing loss Nose/Mouth/Throat:  no complaints of nasal congestion or sore throat Cardiovascular:  no chest pain, no palpitations Respiratory:  no cough and no shortness of breath Gastrointestinal:  no abdominal pain, no change in bowel habits GU:   Male: negative for dysuria, frequency, and incontinence and negative for prostate symptoms Musculoskeletal/Extremities:  no pain, redness, or swelling of the joints Integumentary (Skin):  no abnormal skin lesions reported Neurologic:  no headaches, Endocrine:  No unexpected weight changes Hematologic/Lymphatic:  no areas of easy bruising  Exam BP 130/82 (BP Location: Left Arm, Patient Position: Sitting, Cuff Size: Normal)   Pulse 75   Temp 98.1 F (36.7 C) (Oral)   Ht 5\' 11"  (1.803 m)   Wt 197 lb 8 oz (89.6 kg)   SpO2 96%   BMI 27.55 kg/m  General:  well developed, well nourished, in no apparent distress Skin:  no significant moles, warts, or growths Head:  no masses, lesions, or tenderness Eyes:  pupils equal and round, sclera anicteric without injection Ears:  canals without lesions, TMs shiny without retraction, no obvious effusion, no erythema Nose:  nares patent, septum midline, mucosa normal Throat/Pharynx:  lips and gingiva without lesion; tongue and uvula midline; non-inflamed pharynx; no exudates or postnasal drainage Neck: neck supple without adenopathy, thyromegaly, or masses Lungs:  clear to auscultation, breath sounds equal bilaterally, no respiratory distress Cardio:  regular rate and rhythm, no LE edema or bruits Abdomen:  abdomen soft, nontender;  bowel sounds normal; no masses or organomegaly Genital (male): Uncircumcised penis, no lesions or discharge; testes present bilaterally without masses or tenderness Rectal: Deferred Musculoskeletal:  symmetrical muscle groups noted without atrophy or deformity Extremities:  no clubbing, cyanosis, or edema, no deformities, no skin discoloration Neuro:  gait normal; deep tendon reflexes normal and symmetric Psych: well oriented with normal range of affect and appropriate judgment/insight  Assessment and Plan  Well adult exam - Plan: Comprehensive metabolic panel, Lipid panel  Screening for malignant neoplasm of prostate -  Plan: PSA  Encounter for hepatitis C screening test for low risk patient - Plan: Hepatitis C antibody  Screen for colon cancer - Plan: Ambulatory referral to Gastroenterology   Well 72 y.o. male. Counseled on diet and exercise. Refuses shingles vaccine.  Counseled on risks and benefits of PSA screening. He wishes to be screened.  Other orders as above. Follow up in 1 yr or prn.  The patient voiced understanding and agreement to the plan.  [place], DO 03/18/18 10:46 AM

## 2018-03-18 NOTE — Patient Instructions (Addendum)
Give [place] 2-3 business days to get the results of your labs back.   Keep the diet clean and stay active.  If you change your mind about the shingles vaccine, let me know.  Let us know if you need anything.

## 2018-03-18 NOTE — Progress Notes (Signed)
Pre visit review using our clinic review tool, if applicable. No additional management support is needed unless otherwise documented below in the visit note. 

## 2018-03-19 ENCOUNTER — Other Ambulatory Visit: Payer: Self-pay | Admitting: Family Medicine

## 2018-03-19 DIAGNOSIS — E781 Pure hyperglyceridemia: Secondary | ICD-10-CM

## 2018-03-19 DIAGNOSIS — R739 Hyperglycemia, unspecified: Secondary | ICD-10-CM

## 2018-03-19 LAB — HEPATITIS C ANTIBODY
HEP C AB: NONREACTIVE
SIGNAL TO CUT-OFF: 0.01 (ref <1.00)

## 2018-03-26 ENCOUNTER — Other Ambulatory Visit: Payer: Medicare Other

## 2018-03-26 ENCOUNTER — Other Ambulatory Visit (INDEPENDENT_AMBULATORY_CARE_PROVIDER_SITE_OTHER): Payer: Medicare Other

## 2018-03-26 DIAGNOSIS — R739 Hyperglycemia, unspecified: Secondary | ICD-10-CM | POA: Diagnosis not present

## 2018-03-26 DIAGNOSIS — E781 Pure hyperglyceridemia: Secondary | ICD-10-CM

## 2018-03-26 LAB — LIPID PANEL
Cholesterol: 194 mg/dL (ref 0–200)
HDL: 36.9 mg/dL — ABNORMAL LOW (ref >39.00)
NonHDL: 157.21
TRIGLYCERIDES: 357 mg/dL — AB (ref 0.0–149.0)
Total CHOL/HDL Ratio: 5
VLDL: 71.4 mg/dL — ABNORMAL HIGH (ref 0.0–40.0)

## 2018-03-26 LAB — HEMOGLOBIN A1C: HEMOGLOBIN A1C: 7.3 % — AB (ref 4.6–6.5)

## 2018-03-26 LAB — LDL CHOLESTEROL, DIRECT: LDL DIRECT: 92 mg/dL

## 2018-03-29 ENCOUNTER — Encounter: Payer: Self-pay | Admitting: Family Medicine

## 2018-03-29 DIAGNOSIS — E669 Obesity, unspecified: Secondary | ICD-10-CM | POA: Insufficient documentation

## 2018-03-29 DIAGNOSIS — E781 Pure hyperglyceridemia: Secondary | ICD-10-CM | POA: Insufficient documentation

## 2018-03-29 DIAGNOSIS — E119 Type 2 diabetes mellitus without complications: Secondary | ICD-10-CM | POA: Insufficient documentation

## 2018-03-29 DIAGNOSIS — E1169 Type 2 diabetes mellitus with other specified complication: Secondary | ICD-10-CM | POA: Insufficient documentation

## 2018-04-07 ENCOUNTER — Encounter: Payer: Self-pay | Admitting: Family Medicine

## 2018-04-07 ENCOUNTER — Ambulatory Visit: Payer: Medicare Other | Admitting: Family Medicine

## 2018-04-07 VITALS — BP 128/80 | HR 65 | Temp 98.2°F | Ht 70.0 in | Wt 198.1 lb

## 2018-04-07 DIAGNOSIS — E781 Pure hyperglyceridemia: Secondary | ICD-10-CM

## 2018-04-07 DIAGNOSIS — E119 Type 2 diabetes mellitus without complications: Secondary | ICD-10-CM

## 2018-04-07 LAB — MICROALBUMIN / CREATININE URINE RATIO
Creatinine,U: 207 mg/dL
MICROALB/CREAT RATIO: 2.6 mg/g (ref 0.0–30.0)
Microalb, Ur: 5.4 mg/dL — ABNORMAL HIGH (ref 0.0–1.9)

## 2018-04-07 MED ORDER — ATORVASTATIN CALCIUM 40 MG PO TABS: 40.0000 mg | ORAL_TABLET | Freq: Every day | ORAL | 3 refills | Status: DC

## 2018-04-07 MED ORDER — ONETOUCH LANCETS MISC: 1 refills | Status: AC

## 2018-04-07 MED ORDER — ONETOUCH VERIO W/DEVICE KIT: PACK | 0 refills | Status: AC

## 2018-04-07 MED ORDER — GLUCOSE BLOOD VI STRP: ORAL_STRIP | 1 refills | Status: DC

## 2018-04-07 NOTE — Progress Notes (Signed)
Pre visit review using our clinic review tool, if applicable. No additional management support is needed unless otherwise documented below in the visit note. 

## 2018-04-07 NOTE — Progress Notes (Signed)
Chief Complaint  Patient presents with  . Results    Subjective: Patient is a 72 y.o. male here for lab f/u.  A1c 7.3 and TG's >350. Diet consists of him eating when he wants. Tries to stay active, no big change since receiving dx over phone.  He does have family members with diabetes.  ROS: Heart: Denies chest pain  Lungs: Denies SOB   Past Medical History:  Diagnosis Date  . Degenerative joint disease   . Diabetes mellitus type 2, noninsulin dependent ([place])   . GERD (gastroesophageal reflux disease)   . Hiatal hernia   . Hx of urethral stricture   . Hypertriglyceridemia   . Personal history of skin cancer     Objective: BP 128/80 (BP Location: Left Arm, Patient Position: Sitting, Cuff Size: Normal)   Pulse 65   Temp 98.2 F (36.8 C) (Oral)   Ht 5\' 10"  (1.778 m)   Wt 198 lb 2 oz (89.9 kg)   SpO2 96%   BMI 28.43 kg/m  General: Awake, appears stated age Lungs: No accessory muscle use Skin: No ext lesions on feet Neuro: Skin intact to pinprick b/l Psych: Age appropriate judgment and insight, normal affect and mood  Assessment and Plan: Diabetes mellitus type 2, noninsulin dependent ([place]) - Plan: Ambulatory referral to Ophthalmology, Microalbumin / creatinine urine ratio, HM DIABETES FOOT EXAM  Orders as above. Ck sugars at home intermittently. Healthy diet handout given. Counseled on diet and exercise. F/u in 3 mo. The patient voiced understanding and agreement to the plan.  Greater than 25 minutes were spent face to face with the patient with greater than 50% of this time spent counseling on etiology, diagnosis, diet/exercise, prognosis and maintenance of diabetes.    [place], DO 04/07/18  10:33 AM

## 2018-04-07 NOTE — Patient Instructions (Addendum)
Check your sugars around 2-3 times per week. Alternate checking in the morning before you eat, in the afternoon and before bed. Write them down and bring it to your next appointment.   Keep the diet clean and stay active.   Let us know if you need anything.  Healthy Eating Plan Many factors influence your heart health, including eating and exercise habits. Heart (coronary) risk increases with abnormal blood fat (lipid) levels. Heart-healthy meal planning includes limiting unhealthy fats, increasing healthy fats, and making other small dietary changes. This includes maintaining a healthy body weight to help keep lipid levels within a normal range.  WHAT IS MY PLAN?  Your health care provider recommends that you:  Drink a glass of water before meals to help with satiety.  Eat slowly.  An alternative to the water is to add Metamucil. This will help with satiety as well. It does contain calories, unlike water.  WHAT TYPES OF FAT SHOULD I CHOOSE?  Choose healthy fats more often. Choose monounsaturated and polyunsaturated fats, such as olive oil and canola oil, flaxseeds, walnuts, almonds, and seeds.  Eat more omega-3 fats. Good choices include salmon, mackerel, sardines, tuna, flaxseed oil, and ground flaxseeds. Aim to eat fish at least two times each week.  Avoid foods with partially hydrogenated oils in them. These contain trans fats. Examples of foods that contain trans fats are stick margarine, some tub margarines, cookies, crackers, and other baked goods. If you are going to avoid a fat, this is the one to avoid!  WHAT GENERAL GUIDELINES DO I NEED TO FOLLOW?  Check food labels carefully to identify foods with trans fats. Avoid these types of options when possible.  Fill one half of your plate with vegetables and green salads. Eat 4-5 servings of vegetables per day. A serving of vegetables equals 1 cup of raw leafy vegetables,  cup of raw or cooked cut-up vegetables, or  cup of  vegetable juice.  Fill one fourth of your plate with whole grains. Look for the word "whole" as the first word in the ingredient list.  Fill one fourth of your plate with lean protein foods.  Eat 4-5 servings of fruit per day. A serving of fruit equals one medium whole fruit,  cup of dried fruit,  cup of fresh, frozen, or canned fruit. Try to avoid fruits in cups/syrups as the sugar content can be high.  Eat more foods that contain soluble fiber. Examples of foods that contain this type of fiber are apples, broccoli, carrots, beans, peas, and barley. Aim to get 20-30 g of fiber per day.  Eat more home-cooked food and less restaurant, buffet, and fast food.  Limit or avoid alcohol.  Limit foods that are high in starch and sugar.  Avoid fried foods when able.  Cook foods by using methods other than frying. Baking, boiling, grilling, and broiling are all great options. Other fat-reducing suggestions include: ? Removing the skin from poultry. ? Removing all visible fats from meats. ? Skimming the fat off of stews, soups, and gravies before serving them. ? Steaming vegetables in water or broth.  Lose weight if you are overweight. Losing just 5-10% of your initial body weight can help your overall health and prevent diseases such as diabetes and heart disease.  Increase your consumption of nuts, legumes, and seeds to 4-5 servings per week. One serving of dried beans or legumes equals  cup after being cooked, one serving of nuts equals 1 ounces, and one serving of  seeds equals  ounce or 1 tablespoon.  WHAT ARE GOOD FOODS CAN I EAT? Grains Grainy breads (try to find bread that is 3 g of fiber per slice or greater), oatmeal, light popcorn. Whole-grain cereals. Rice and pasta, including brown rice and those that are made with whole wheat. Edamame pasta is a great alternative to grain pasta. It has a higher protein content. Try to avoid significant consumption of white bread, sugary cereals,  or pastries/baked goods.  Vegetables All vegetables. Cooked white potatoes do not count as vegetables.  Fruits All fruits, but limit pineapple and bananas as these fruits have a higher sugar content.  Meats and Other Protein Sources Lean, well-trimmed beef, veal, pork, and lamb. Chicken and [place] without skin. All fish and shellfish. Wild duck, rabbit, pheasant, and venison. Egg whites or low-cholesterol egg substitutes. Dried beans, peas, lentils, and tofu.Seeds and most nuts.  Dairy Low-fat or nonfat cheeses, including ricotta, string, and mozzarella. Skim or 1% milk that is liquid, powdered, or evaporated. Buttermilk that is made with low-fat milk. Nonfat or low-fat yogurt. Soy/Almond milk are good alternatives if you cannot handle dairy.  Beverages Water is the best for you. Sports drinks with less sugar are more desirable unless you are a highly active athlete.  Sweets and Desserts Sherbets and fruit ices. Honey, jam, marmalade, jelly, and syrups. Dark chocolate.  Eat all sweets and desserts in moderation.  Fats and Oils Nonhydrogenated (trans-free) margarines. Vegetable oils, including soybean, sesame, sunflower, olive, peanut, safflower, corn, canola, and cottonseed. Salad dressings or mayonnaise that are made with a vegetable oil. Limit added fats and oils that you use for cooking, baking, salads, and as spreads.  Other Cocoa powder. Coffee and tea. Most condiments.  The items listed above may not be a complete list of recommended foods or beverages. Contact your dietitian for more options.

## 2018-04-14 ENCOUNTER — Telehealth: Payer: Self-pay | Admitting: Family Medicine

## 2018-04-14 NOTE — Telephone Encounter (Signed)
We can try, but please inform pt this is not emergent where it is medically necessary to be seen prior to then.   GD- any other group that would be able to see him sooner? TY.

## 2018-04-14 NOTE — Telephone Encounter (Signed)
Can you help with this?

## 2018-04-14 NOTE — Telephone Encounter (Signed)
Copied from [place] [id number]. Topic: Referral - Status >> Apr 13, 2018 12:29 PM Yvette Rack wrote: Reason for CRM: Pt states he received a call regarding the referral to an eye doctor but he was advised that they can not see him until January. Pt requests a referral to another eye doctor that will be able to see him prior to January.

## 2018-05-19 ENCOUNTER — Encounter: Payer: Self-pay | Admitting: Family Medicine

## 2018-07-08 ENCOUNTER — Ambulatory Visit (INDEPENDENT_AMBULATORY_CARE_PROVIDER_SITE_OTHER): Payer: Medicare Other | Admitting: Family Medicine

## 2018-07-08 ENCOUNTER — Encounter: Payer: Self-pay | Admitting: Family Medicine

## 2018-07-08 VITALS — BP 130/84 | HR 84 | Temp 98.4°F | Ht 70.0 in | Wt 198.2 lb

## 2018-07-08 DIAGNOSIS — E119 Type 2 diabetes mellitus without complications: Secondary | ICD-10-CM

## 2018-07-08 DIAGNOSIS — E781 Pure hyperglyceridemia: Secondary | ICD-10-CM | POA: Diagnosis not present

## 2018-07-08 LAB — LIPID PANEL
CHOL/HDL RATIO: 3
CHOLESTEROL: 125 mg/dL (ref 0–200)
HDL: 37.6 mg/dL — ABNORMAL LOW (ref >39.00)
NONHDL: 87.46
Triglycerides: 325 mg/dL — ABNORMAL HIGH (ref 0.0–149.0)
VLDL: 65 mg/dL — ABNORMAL HIGH (ref 0.0–40.0)

## 2018-07-08 LAB — LDL CHOLESTEROL, DIRECT: LDL DIRECT: 37 mg/dL

## 2018-07-08 LAB — HEMOGLOBIN A1C: HEMOGLOBIN A1C: 7.6 % — AB (ref 4.6–6.5)

## 2018-07-08 NOTE — Progress Notes (Signed)
Pre visit review using our clinic review tool, if applicable. No additional management support is needed unless otherwise documented below in the visit note. 

## 2018-07-08 NOTE — Patient Instructions (Signed)
Give us 2-3 business days to get the results of your labs back.   Keep the diet clean and stay active.  Let us know if you need anything. 

## 2018-07-08 NOTE — Progress Notes (Signed)
Subjective:   Chief Complaint  Patient presents with  . Follow-up    blood sugar and triglycerides    Jeffrey Dodson is a 73 y.o. male here for follow-up of diabetes.   Jeffrey Dodson's self monitored glucose range is mid 100's on average. Patient does not require insulin.   Medications include: diet controlled Exercise: walking Diet has improved.  Hyperlipidemia Patient presents for dyslipidemia follow up. Currently being treated with Lipitor 40 mg/d and compliance with treatment thus far has been good. He denies myalgias. He is adhering to a healthy. Exercise:  The patient is not known to have coexisting coronary artery disease.   Past Medical History:  Diagnosis Date  . Degenerative joint disease   . Diabetes mellitus type 2, noninsulin dependent ([place])   . GERD (gastroesophageal reflux disease)   . Hiatal hernia   . Hx of urethral stricture   . Hypertriglyceridemia   . Personal history of skin cancer      Related testing: Date of retinal exam: Done Pneumovax: done Flu Shot: done  Review of Systems: Pulmonary:  No SOB Cardiovascular:  No chest pain  Objective:  BP 130/84 (BP Location: Left Arm, Patient Position: Sitting, Cuff Size: Normal)   Pulse 84   Temp 98.4 F (36.9 C) (Oral)   Ht 5\' 10"  (1.778 m)   Wt 198 lb 4 oz (89.9 kg)   SpO2 97%   BMI 28.45 kg/m  General:  Well developed, well nourished, in no apparent distress Eyes:  Pupils equal and round, sclera anicteric without injection  Lungs:  CTAB, no access msc use Cardio:  RRR, no bruits, no LE edema Psych: Age appropriate judgment and insight  Assessment:   Diabetes mellitus type 2, noninsulin dependent ([facility])  Hypertriglyceridemia   Plan:   Orders as above. Counseled on diet and exercise. He is doing well.  Goal is <8 for age.  F/u in 3 mo. The patient voiced understanding and agreement to the plan.  [place], DO 07/08/18 9:58 AM

## 2018-07-12 ENCOUNTER — Other Ambulatory Visit: Payer: Self-pay | Admitting: Family Medicine

## 2018-07-12 MED ORDER — ATORVASTATIN CALCIUM 80 MG PO TABS: 80.0000 mg | ORAL_TABLET | Freq: Every day | ORAL | 1 refills | Status: DC

## 2018-07-16 ENCOUNTER — Telehealth: Payer: Self-pay | Admitting: *Deleted

## 2018-07-16 NOTE — Telephone Encounter (Signed)
Received Notes & Dermatopathology Report results from [facility]; forwarded to provider/SLS 01/31

## 2018-10-07 ENCOUNTER — Ambulatory Visit: Payer: Medicare Other | Admitting: Family Medicine

## 2018-10-08 ENCOUNTER — Ambulatory Visit (INDEPENDENT_AMBULATORY_CARE_PROVIDER_SITE_OTHER): Payer: Medicare Other | Admitting: Family Medicine

## 2018-10-08 ENCOUNTER — Encounter: Payer: Self-pay | Admitting: Family Medicine

## 2018-10-08 ENCOUNTER — Other Ambulatory Visit: Payer: Self-pay

## 2018-10-08 DIAGNOSIS — E781 Pure hyperglyceridemia: Secondary | ICD-10-CM

## 2018-10-08 DIAGNOSIS — E119 Type 2 diabetes mellitus without complications: Secondary | ICD-10-CM

## 2018-10-08 NOTE — Progress Notes (Signed)
Subjective:   Chief Complaint  Patient presents with  . Follow-up    Jeffrey Dodson is a 73 y.o. male here for follow-up of diabetes.  Due to outbreak, we attemped interacting via web portal for an electronic face-to-face visit. Due to lack of appropriate technology by patient, we settled for a phone visit. I verified patient's ID using 2 identifiers.   Jeffrey Dodson's self monitored glucose range is 150's. Patient denies hypoglycemic reactions. Patient does not know require insulin.   Medications include: currently diet controlled Exercise: some walking  Hyperlipidemia Patient presents for dyslipidemia follow up. Currently being treated with Atorvastatin 80 mg/d and compliance with treatment thus far has been good. He denies myalgias. He is adhering to a healthy. Exercise: some walking The patient is not known to have coexisting coronary artery disease.  Past Medical History:  Diagnosis Date  . Degenerative joint disease   . Diabetes mellitus type 2, noninsulin dependent ([place])   . GERD (gastroesophageal reflux disease)   . Hiatal hernia   . Hx of urethral stricture   . Hypertriglyceridemia   . Personal history of skin cancer      Related testing: Date of retinal exam: Done Pneumovax: done Flu Shot: done  Review of Systems: Pulmonary:  No SOB Cardiovascular:  No chest pain  Objective:  No conversational dyspnea Age appropriate judgment and insight Nml affect and mood  Assessment:   Diabetes mellitus type 2, noninsulin dependent ([place]) - Plan: Hemoglobin A1c  Hypertriglyceridemia - Plan: Lipid panel   Plan:   1- controlled. Counseled on diet and exercise. 2- Uncontrolled- ck lipid, will add fibrate if no improvement.  F/u in 6 mo for CPE pending above results. The patient voiced understanding and agreement to the plan.  [place], DO 10/08/18 10:13 AM

## 2018-10-12 ENCOUNTER — Other Ambulatory Visit (INDEPENDENT_AMBULATORY_CARE_PROVIDER_SITE_OTHER): Payer: Medicare Other

## 2018-10-12 ENCOUNTER — Other Ambulatory Visit: Payer: Self-pay

## 2018-10-12 DIAGNOSIS — E119 Type 2 diabetes mellitus without complications: Secondary | ICD-10-CM | POA: Diagnosis not present

## 2018-10-12 DIAGNOSIS — E781 Pure hyperglyceridemia: Secondary | ICD-10-CM

## 2018-10-12 LAB — LIPID PANEL
Cholesterol: 103 mg/dL (ref 0–200)
HDL: 36.5 mg/dL — ABNORMAL LOW (ref >39.00)
NonHDL: 66.76
Total CHOL/HDL Ratio: 3
Triglycerides: 241 mg/dL — ABNORMAL HIGH (ref 0.0–149.0)
VLDL: 48.2 mg/dL — ABNORMAL HIGH (ref 0.0–40.0)

## 2018-10-12 LAB — HEMOGLOBIN A1C: Hgb A1c MFr Bld: 7.5 % — ABNORMAL HIGH (ref 4.6–6.5)

## 2018-10-12 LAB — LDL CHOLESTEROL, DIRECT: Direct LDL: 31 mg/dL

## 2018-10-13 ENCOUNTER — Other Ambulatory Visit: Payer: Self-pay | Admitting: Family Medicine

## 2018-10-13 DIAGNOSIS — E781 Pure hyperglyceridemia: Secondary | ICD-10-CM

## 2018-11-24 ENCOUNTER — Other Ambulatory Visit (INDEPENDENT_AMBULATORY_CARE_PROVIDER_SITE_OTHER): Payer: Medicare Other

## 2018-11-24 ENCOUNTER — Other Ambulatory Visit: Payer: Self-pay

## 2018-11-24 DIAGNOSIS — E781 Pure hyperglyceridemia: Secondary | ICD-10-CM

## 2018-11-24 LAB — LIPID PANEL
Cholesterol: 99 mg/dL (ref 0–200)
HDL: 37.2 mg/dL — ABNORMAL LOW (ref >39.00)
LDL Cholesterol: 23 mg/dL (ref 0–99)
NonHDL: 61.69
Total CHOL/HDL Ratio: 3
Triglycerides: 192 mg/dL — ABNORMAL HIGH (ref 0.0–149.0)
VLDL: 38.4 mg/dL (ref 0.0–40.0)

## 2019-04-07 ENCOUNTER — Other Ambulatory Visit: Payer: Self-pay

## 2019-04-08 ENCOUNTER — Telehealth: Payer: Self-pay

## 2019-04-08 ENCOUNTER — Encounter: Payer: Self-pay | Admitting: Family Medicine

## 2019-04-08 ENCOUNTER — Ambulatory Visit (INDEPENDENT_AMBULATORY_CARE_PROVIDER_SITE_OTHER): Payer: Medicare Other | Admitting: Family Medicine

## 2019-04-08 VITALS — BP 124/82 | HR 51 | Temp 97.3°F | Ht 70.0 in | Wt 197.5 lb

## 2019-04-08 DIAGNOSIS — E669 Obesity, unspecified: Secondary | ICD-10-CM | POA: Diagnosis not present

## 2019-04-08 DIAGNOSIS — Z0000 Encounter for general adult medical examination without abnormal findings: Secondary | ICD-10-CM

## 2019-04-08 DIAGNOSIS — E1169 Type 2 diabetes mellitus with other specified complication: Secondary | ICD-10-CM | POA: Diagnosis not present

## 2019-04-08 LAB — COMPREHENSIVE METABOLIC PANEL
ALT: 19 U/L (ref 0–53)
AST: 20 U/L (ref 0–37)
Albumin: 4.6 g/dL (ref 3.5–5.2)
Alkaline Phosphatase: 73 U/L (ref 39–117)
BUN: 17 mg/dL (ref 6–23)
CO2: 26 mEq/L (ref 19–32)
Calcium: 9.2 mg/dL (ref 8.4–10.5)
Chloride: 108 mEq/L (ref 96–112)
Creatinine, Ser: 1.2 mg/dL (ref 0.40–1.50)
GFR: 59.65 mL/min — ABNORMAL LOW (ref >60.00)
Glucose, Bld: 126 mg/dL — ABNORMAL HIGH (ref 70–99)
Potassium: 4.2 mEq/L (ref 3.5–5.1)
Sodium: 141 mEq/L (ref 135–145)
Total Bilirubin: 0.9 mg/dL (ref 0.2–1.2)
Total Protein: 6.9 g/dL (ref 6.0–8.3)

## 2019-04-08 LAB — CBC
HCT: 38 % — ABNORMAL LOW (ref 39.0–52.0)
Hemoglobin: 13.1 g/dL (ref 13.0–17.0)
MCHC: 34.4 g/dL (ref 30.0–36.0)
MCV: 89.2 fl (ref 78.0–100.0)
Platelets: 157 10*3/uL (ref 150.0–400.0)
RBC: 4.26 Mil/uL (ref 4.22–5.81)
RDW: 12.8 % (ref 11.5–15.5)
WBC: 7.1 10*3/uL (ref 4.0–10.5)

## 2019-04-08 LAB — LIPID PANEL
Cholesterol: 105 mg/dL (ref 0–200)
HDL: 39.9 mg/dL (ref >39.00)
LDL Cholesterol: 40 mg/dL (ref 0–99)
NonHDL: 65.08
Total CHOL/HDL Ratio: 3
Triglycerides: 125 mg/dL (ref 0.0–149.0)
VLDL: 25 mg/dL (ref 0.0–40.0)

## 2019-04-08 LAB — HEMOGLOBIN A1C: Hgb A1c MFr Bld: 7.5 % — ABNORMAL HIGH (ref 4.6–6.5)

## 2019-04-08 LAB — MICROALBUMIN / CREATININE URINE RATIO
Creatinine,U: 173.4 mg/dL
Microalb Creat Ratio: 1.4 mg/g (ref 0.0–30.0)
Microalb, Ur: 2.5 mg/dL — ABNORMAL HIGH (ref 0.0–1.9)

## 2019-04-08 NOTE — Telephone Encounter (Signed)
Cologuard kit ordered through [employer] portal.

## 2019-04-08 NOTE — Progress Notes (Signed)
Chief Complaint  Patient presents with  . Annual Exam    Well Male Jeffrey Dodson is here for a complete physical.   His last physical was >1 year ago.  Current diet: in general, a "healthy" diet.   Current exercise: active with work Weight trend: stable Daytime fatigue? No. Seat belt? Yes.    Health maintenance Shingrix- No Colonoscopy- Choosing Cologard Tetanus- Yes Hep C- Yes Pneumonia vaccine- Yes  Past Medical History:  Diagnosis Date  . Degenerative joint disease   . Diabetes mellitus type 2, noninsulin dependent ([place])   . GERD (gastroesophageal reflux disease)   . Hiatal hernia   . Hx of urethral stricture   . Hypertriglyceridemia   . Personal history of skin cancer      Past Surgical History:  Procedure Laterality Date  . S/P urethrotomy  11/05   Dr Rosana Hoes    Medications  Current Outpatient Medications on File Prior to Visit  Medication Sig Dispense Refill  . aspirin 81 MG tablet Take 81 mg by mouth daily.    Marland Kitchen atorvastatin (LIPITOR) 80 MG tablet Take 1 tablet (80 mg total) by mouth daily. 90 tablet 1  . Blood Glucose Monitoring Suppl (ONETOUCH VERIO) w/Device KIT Use three times per week to check blood sugar.  E11.9 1 kit 0  . glucose blood (ONETOUCH VERIO) test strip Use three times a week to check blood sugar.  E11.9 100 each 1  . ibuprofen (ADVIL,MOTRIN) 200 MG tablet Take 200 mg by mouth daily as needed for mild pain.     Marland Kitchen KRILL OIL PO Take by mouth.    . ONE TOUCH LANCETS MISC Use to check blood sugar 3 times per week.  E11.9 100 each 1   Allergies No Known Allergies  Family History Family History  Problem Relation Age of Onset  . Deep vein thrombosis Father 7       Deceased  . Other Father        PTE  . Hypertension Mother   . Congestive Heart Failure Unknown   . Diabetes Mellitus II Unknown   . Cirrhosis Brother        Liver, deceased  . Stroke Sister   . Diabetes Sister   . COPD Sister     Review of Systems: Constitutional:  no  fevers or chills Eye:  no recent significant change in vision Ear/Nose/Mouth/Throat:  Ears:  no recent hearing loss Nose/Mouth/Throat:  no complaints of nasal congestion or sore throat Cardiovascular:  no chest pain Respiratory:  no shortness of breath Gastrointestinal:  no abdominal pain, no change in bowel habits GU:  Male: negative for dysuria, frequency, and incontinence  Musculoskeletal/Extremities:  no pain of the joints Integumentary (Skin): +various skin lesions, follows with derm, nothing new/recent Neurologic:  no headaches, Endocrine:  No unexpected weight changes Hematologic/Lymphatic:  no areas of easy bruising  Exam BP 124/82 (BP Location: Left Arm, Patient Position: Sitting, Cuff Size: Normal)   Pulse (!) 51   Temp (!) 97.3 F (36.3 C) (Temporal)   Ht _0  (1.778 m)   Wt 197 lb 8 oz (89.6 kg)   SpO2 96%   BMI 28.34 kg/m  General:  well developed, well nourished, in no apparent distress Skin:  no significant moles, warts, or growths that he points out Head:  no masses, lesions, or tenderness Eyes:  pupils equal and round, sclera anicteric without injection Ears:  canals without lesions, TMs shiny without retraction, no obvious effusion, no erythema Nose:  nares patent, septum midline, mucosa normal Throat/Pharynx:  lips and gingiva without lesion; tongue and uvula midline; non-inflamed pharynx; no exudates or postnasal drainage Neck: neck supple without adenopathy, thyromegaly, or masses Lungs:  clear to auscultation, breath sounds equal bilaterally, no respiratory distress Cardio:  regular rate and rhythm, no LE edema or bruits Rectal: Deferred Musculoskeletal:  symmetrical muscle groups noted without atrophy or deformity Extremities:  no clubbing, cyanosis, or edema, no deformities, no skin discoloration Neuro:  gait normal; deep tendon reflexes normal and symmetric Psych: well oriented with normal range of affect and appropriate judgment/insight  Assessment  and Plan  Well adult exam - Plan: CBC, Comprehensive metabolic panel, Lipid panel  Diabetes mellitus type 2 in obese (HCC) - Plan: Hemoglobin A1c, Microalbumin / creatinine urine ratio, Ambulatory referral to Ophthalmology, HM DIABETES FOOT EXAM   Well 73 y.o. male. Counseled on diet and exercise. Other orders as above. Follow up in 6 mo.  The patient voiced understanding and agreement to the plan.  [place], DO 04/08/19 9:54 AM

## 2019-04-08 NOTE — Patient Instructions (Addendum)
Give [place] 2-3 business days to get the results of your labs back.   Keep the diet clean and stay active.  If you do not hear anything about your eye dr referral in the next 1-2 weeks, call our office and ask for an update.  Send [place] an update when you get your flu shot.   Let us know if you need anything.

## 2019-05-02 ENCOUNTER — Telehealth: Payer: Self-pay | Admitting: Family Medicine

## 2019-05-02 MED ORDER — ATORVASTATIN CALCIUM 80 MG PO TABS: 80.0000 mg | ORAL_TABLET | Freq: Every day | ORAL | 0 refills | Status: DC

## 2019-05-02 NOTE — Telephone Encounter (Signed)
Copied from [place] [id number]. Topic: General - Other >> May 02, 2019  1:59 PM Leward Quan A wrote: Reason for CRM: Brayton Layman with [facility] called to ask Dr [name] to please send over a new Rx for atorvastatin (LIPITOR) 80 MG tablet that states that patient should take one tab every other day. Please advise

## 2019-05-30 LAB — HM DIABETES EYE EXAM

## 2019-05-31 ENCOUNTER — Encounter: Payer: Self-pay | Admitting: Family Medicine

## 2019-06-08 ENCOUNTER — Encounter: Payer: Self-pay | Admitting: Family Medicine

## 2019-06-08 ENCOUNTER — Other Ambulatory Visit: Payer: Self-pay

## 2019-06-08 ENCOUNTER — Telehealth: Payer: Self-pay | Admitting: Family Medicine

## 2019-06-08 ENCOUNTER — Ambulatory Visit (INDEPENDENT_AMBULATORY_CARE_PROVIDER_SITE_OTHER): Payer: Medicare Other | Admitting: Family Medicine

## 2019-06-08 DIAGNOSIS — J0140 Acute pansinusitis, unspecified: Secondary | ICD-10-CM | POA: Diagnosis not present

## 2019-06-08 MED ORDER — PREDNISONE 20 MG PO TABS: 40.0000 mg | ORAL_TABLET | Freq: Every day | ORAL | 0 refills | Status: AC

## 2019-06-08 MED ORDER — AMOXICILLIN-POT CLAVULANATE 875-125 MG PO TABS: 1.0000 | ORAL_TABLET | Freq: Two times a day (BID) | ORAL | 0 refills | Status: DC

## 2019-06-08 NOTE — Telephone Encounter (Signed)
Copied from [place] [id number]. Topic: General - Other >> Jun 08, 2019 12:35 PM Keene Breath wrote: Reason for CRM: Patient would like the doctor to call in medication for his sinuses.  CB# [phone]

## 2019-06-08 NOTE — Telephone Encounter (Signed)
Called and scheduled virtual today. 

## 2019-06-08 NOTE — Progress Notes (Signed)
Chief Complaint  Patient presents with  . Sinus Problem    Jeffrey Dodson is 73 y.o. male here for possible sinus infection. Due to COVID-19 pandemic, we are interacting via telephone. I verified patient's ID using 2 identifiers. Patient agreed to proceed with visit via this method. Patient is at home, I am at office. Patient and I are present for visit.   Duration: 3 weeks Symptoms include: nasal congestion, clear rhinorrhea, sneezing, headaches, facial pain Denies: cough, fevers, sore throats, cough, fevers Treatment to date: None Sick contacts? No  ROS:  Const: Denies fevers HEENT: As noted in HPI  Past Medical History:  Diagnosis Date  . Degenerative joint disease   . Diabetes mellitus type 2, noninsulin dependent ([place])   . GERD (gastroesophageal reflux disease)   . Hiatal hernia   . Hx of urethral stricture   . Hypertriglyceridemia   . Personal history of skin cancer     Exam No conversational dyspnea Age appropriate judgment and insight Nml affect and mood  Acute pansinusitis, recurrence not specified - Plan: predniSONE (DELTASONE) 20 MG tablet, amoxicillin-clavulanate (AUGMENTIN) 875-125 MG tablet  Given duration will give Augmentin, but since it seems to come back yearly around same time, will ask he try pred burst first.  F/u as originally scheduled.  Total time spent: 11 min The patient voiced understanding and agreement to the plan.  [place], DO 06/08/19 4:18 PM

## 2019-07-31 ENCOUNTER — Ambulatory Visit: Payer: Medicare Other | Attending: Internal Medicine

## 2019-07-31 DIAGNOSIS — Z23 Encounter for immunization: Secondary | ICD-10-CM | POA: Insufficient documentation

## 2019-07-31 NOTE — Progress Notes (Signed)
   Covid-19 Vaccination Clinic  Name:  Jeffrey Dodson    MRN: [id number] DOB: 1945-10-02  07/31/2019  Jeffrey Dodson was observed post Covid-19 immunization for 15 minutes without incidence. He was provided with Vaccine Information Sheet and instruction to access the V-Safe system.   Jeffrey Dodson was instructed to call 911 with any severe reactions post vaccine: Marland Kitchen Difficulty breathing  . Swelling of your face and throat  . A fast heartbeat  . A bad rash all over your body  . Dizziness and weakness    Immunizations Administered    Name Date Dose VIS Date Route   Pfizer COVID-19 Vaccine 07/31/2019  2:49 PM 0.3 mL 05/27/2019 Intramuscular   Manufacturer: [place]   Lot: [id number]   [place]: [id number]

## 2019-08-23 ENCOUNTER — Ambulatory Visit: Payer: Medicare Other | Attending: Internal Medicine

## 2019-08-23 DIAGNOSIS — Z23 Encounter for immunization: Secondary | ICD-10-CM

## 2019-08-23 NOTE — Progress Notes (Signed)
   Covid-19 Vaccination Clinic  Name:  Jeffrey Dodson    MRN: [id number] DOB: 21-Sep-1945  08/23/2019  [name] was observed post Covid-19 immunization for 15 minutes without incident. He was provided with Vaccine Information Sheet and instruction to access the V-Safe system.   [name] was instructed to call 911 with any severe reactions post vaccine: Marland Kitchen Difficulty breathing  . Swelling of face and throat  . A fast heartbeat  . A bad rash all over body  . Dizziness and weakness   Immunizations Administered    Name Date Dose VIS Date Route   Pfizer COVID-19 Vaccine 08/23/2019  8:59 AM 0.3 mL 05/27/2019 Intramuscular   Manufacturer: [place]   Lot: [id number]   [place]: [id number]

## 2019-08-24 ENCOUNTER — Ambulatory Visit: Payer: Medicare Other

## 2019-11-07 ENCOUNTER — Encounter: Payer: Self-pay | Admitting: Family Medicine

## 2019-12-06 ENCOUNTER — Ambulatory Visit: Payer: Medicare Other | Admitting: *Deleted

## 2019-12-10 ENCOUNTER — Emergency Department (HOSPITAL_COMMUNITY)
Admission: EM | Admit: 2019-12-10 | Discharge: 2019-12-10 | Disposition: A | Discharge status: Home / Self Care | Payer: Medicare Other | Attending: Emergency Medicine | Admitting: Emergency Medicine

## 2019-12-10 ENCOUNTER — Emergency Department (HOSPITAL_COMMUNITY): Payer: Medicare Other

## 2019-12-10 ENCOUNTER — Encounter (HOSPITAL_COMMUNITY): Payer: Self-pay

## 2019-12-10 ENCOUNTER — Other Ambulatory Visit: Payer: Self-pay

## 2019-12-10 DIAGNOSIS — K2100 Gastro-esophageal reflux disease with esophagitis, without bleeding: Secondary | ICD-10-CM

## 2019-12-10 DIAGNOSIS — E119 Type 2 diabetes mellitus without complications: Secondary | ICD-10-CM | POA: Insufficient documentation

## 2019-12-10 DIAGNOSIS — Z7982 Long term (current) use of aspirin: Secondary | ICD-10-CM | POA: Diagnosis not present

## 2019-12-10 DIAGNOSIS — R0789 Other chest pain: Secondary | ICD-10-CM | POA: Insufficient documentation

## 2019-12-10 DIAGNOSIS — Z79899 Other long term (current) drug therapy: Secondary | ICD-10-CM | POA: Diagnosis not present

## 2019-12-10 DIAGNOSIS — R079 Chest pain, unspecified: Secondary | ICD-10-CM

## 2019-12-10 DIAGNOSIS — K219 Gastro-esophageal reflux disease without esophagitis: Secondary | ICD-10-CM | POA: Diagnosis not present

## 2019-12-10 LAB — BASIC METABOLIC PANEL
Anion gap: 11 (ref 5–15)
BUN: 16 mg/dL (ref 8–23)
CO2: 23 mmol/L (ref 22–32)
Calcium: 9.3 mg/dL (ref 8.9–10.3)
Chloride: 103 mmol/L (ref 98–111)
Creatinine, Ser: 1.41 mg/dL — ABNORMAL HIGH (ref 0.61–1.24)
GFR calc Af Amer: 58 mL/min — ABNORMAL LOW (ref >60)
GFR calc non Af Amer: 50 mL/min — ABNORMAL LOW (ref >60)
Glucose, Bld: 238 mg/dL — ABNORMAL HIGH (ref 70–99)
Potassium: 4 mmol/L (ref 3.5–5.1)
Sodium: 137 mmol/L (ref 135–145)

## 2019-12-10 LAB — CBC
HCT: 44.9 % (ref 39.0–52.0)
Hemoglobin: 15.3 g/dL (ref 13.0–17.0)
MCH: 29.7 pg (ref 26.0–34.0)
MCHC: 34.1 g/dL (ref 30.0–36.0)
MCV: 87 fL (ref 80.0–100.0)
Platelets: 159 10*3/uL (ref 150–400)
RBC: 5.16 MIL/uL (ref 4.22–5.81)
RDW: 12.2 % (ref 11.5–15.5)
WBC: 6.4 10*3/uL (ref 4.0–10.5)
nRBC: 0 % (ref 0.0–0.2)

## 2019-12-10 LAB — TROPONIN I (HIGH SENSITIVITY)
Troponin I (High Sensitivity): 6 ng/L (ref <18)
Troponin I (High Sensitivity): 8 ng/L (ref <18)

## 2019-12-10 MED ORDER — SODIUM CHLORIDE 0.9% FLUSH: 3.0000 mL | Freq: Once | INTRAVENOUS | Status: DC

## 2019-12-10 NOTE — ED Triage Notes (Signed)
Patient complains of left anterior cp and left arm pain. Reports that he has had several episodes in past for same and no diagnosis. Reports that the pain stated at 0700

## 2019-12-10 NOTE — ED Notes (Signed)
Got patient on the monitor patient is resting with call bell in reach  ?

## 2019-12-10 NOTE — ED Provider Notes (Signed)
[facility] EMERGENCY DEPARTMENT Provider Note   CSN: [id number] Arrival date & time: 12/10/19  1108     History No chief complaint on file.   Jeffrey Dodson is a 74 y.o. male.  Patient is a 74 year old male with a history of hypertriglyceridemia/dm who is presenting today with complaint of chest pain.  Patient states that approximately 7 AM he woke up with a burning sensation in the lower sternum.  He sat up in bed and took some Maalox which she states caused the burning sensation to go away but then he developed a mild pain in the back of his left arm.  He states then after a while the pain in the arm went away and the burning came back in the lower sternum which again went away with Maalox.  This is been intermittent since 7 AM.  It is not related to exertion.  He denies any exertional symptoms and is very active and states does not even get short of breath.  He has not had any cough or congestion.  He denies any nausea or vomiting today and has had no changes in his bowel movements.  Patient reports he has had symptoms like this multiple times over the years.  Approximately 6 years ago he was seen in [place] for identical pain and was admitted overnight with a treadmill test and everything turned out normal.  Over 10 years ago he had a normal coronary catheterization.  Patient reports he does take ibuprofen almost daily for arthritis pain and has had a hiatal hernia for years but other than taking Krill oil has not been on PPIs.  The history is provided by the patient and medical records.       Past Medical History:  Diagnosis Date  . Degenerative joint disease   . Diabetes mellitus type 2, noninsulin dependent ([place])   . GERD (gastroesophageal reflux disease)   . Hiatal hernia   . Hx of urethral stricture   . Hypertriglyceridemia   . Personal history of skin cancer     Patient Active Problem List   Diagnosis Date Noted  . Hypertriglyceridemia   . Diabetes  mellitus type 2 in obese ([place])   . Atypical chest pain 12/25/2013  . Abnormal blood sugar 12/14/2013  . Other fatigue 12/14/2013  . HYPERCHOLESTEROLEMIA, BORDERLINE 02/19/2008  . URETHRAL STRICTURE 02/19/2008  . DEGENERATIVE JOINT DISEASE 02/19/2008  . CHEST PAIN, ATYPICAL 02/19/2008  . SKIN CANCER, HX OF 02/19/2008  . GERD 02/16/2008  . HIATAL HERNIA 02/16/2008  . DIVERTICULOSIS OF COLON 02/16/2008    Past Surgical History:  Procedure Laterality Date  . S/P urethrotomy  11/05   Dr Rosana Hoes       Family History  Problem Relation Age of Onset  . Deep vein thrombosis Father 39       Deceased  . Other Father        PTE  . Hypertension Mother   . Congestive Heart Failure Other   . Diabetes Mellitus II Other   . Cirrhosis Brother        Liver, deceased  . Stroke Sister   . Diabetes Sister   . COPD Sister     Social History   Tobacco Use  . Smoking status: Never Smoker  Substance Use Topics  . Alcohol use: No    Alcohol/week: 0.0 standard drinks  . Drug use: No    Home Medications Prior to Admission medications   Medication Sig Start Date End  Date Taking? Authorizing Provider  amoxicillin-clavulanate (AUGMENTIN) 875-125 MG tablet Take 1 tablet by mouth 2 (two) times daily. 06/11/19   Wendling, Nicholas Paul, DO  aspirin 81 MG tablet Take 81 mg by mouth daily.    [provider]  atorvastatin (LIPITOR) 80 MG tablet Take 1 tablet (80 mg total) by mouth daily. 05/02/19   Wendling, Nicholas Paul, DO  Blood Glucose Monitoring Suppl (ONETOUCH VERIO) w/Device KIT Use three times per week to check blood sugar.  E11.9 04/07/18   Wendling, Nicholas Paul, DO  glucose blood (ONETOUCH VERIO) test strip Use three times a week to check blood sugar.  E11.9 04/07/18   Wendling, Nicholas Paul, DO  ibuprofen (ADVIL,MOTRIN) 200 MG tablet Take 200 mg by mouth daily as needed for mild pain.     [provider]  KRILL OIL PO Take by mouth.    [provider]  ONE  TOUCH LANCETS MISC Use to check blood sugar 3 times per week.  E11.9 04/07/18   Wendling, Nicholas Paul, DO    Allergies    Patient has no known allergies.  Review of Systems   Review of Systems  All other systems reviewed and are negative.   Physical Exam Updated Vital Signs BP (!) 155/79   Pulse (!) 50   Temp 98.2 F (36.8 C) (Oral)   Resp 10   SpO2 100%   Physical Exam Vitals and nursing note reviewed.  Constitutional:      General: He is not in acute distress.    Appearance: Normal appearance. He is well-developed and normal weight.  HENT:     Head: Normocephalic and atraumatic.     Mouth/Throat:     Mouth: Mucous membranes are moist.  Eyes:     Conjunctiva/sclera: Conjunctivae normal.     Pupils: Pupils are equal, round, and reactive to light.  Cardiovascular:     Rate and Rhythm: Regular rhythm. Bradycardia present.     Pulses: Normal pulses.     Heart sounds: No murmur heard.   Pulmonary:     Effort: Pulmonary effort is normal. No respiratory distress.     Breath sounds: Normal breath sounds. No wheezing or rales.  Abdominal:     General: There is no distension.     Palpations: Abdomen is soft.     Tenderness: There is no abdominal tenderness. There is no guarding or rebound.  Musculoskeletal:        General: No tenderness. Normal range of motion.     Cervical back: Normal range of motion and neck supple.     Right lower leg: No edema.     Left lower leg: No edema.  Skin:    General: Skin is warm and dry.     Findings: No erythema or rash.  Neurological:     General: No focal deficit present.     Mental Status: He is alert and oriented to person, place, and time. Mental status is at baseline.  Psychiatric:        Mood and Affect: Mood normal.        Behavior: Behavior normal.        Thought Content: Thought content normal.     ED Results / Procedures / Treatments   Labs (all labs ordered are listed, but only abnormal results are displayed) Labs  Reviewed  BASIC METABOLIC PANEL - Abnormal; Notable for the following components:      Result Value   Glucose, Bld 238 (*)      Creatinine, Ser 1.41 (*)    GFR calc non Af Amer 50 (*)    GFR calc Af Amer 58 (*)    All other components within normal limits  CBC  TROPONIN I (HIGH SENSITIVITY)  TROPONIN I (HIGH SENSITIVITY)    EKG EKG Interpretation  Date/Time:  Saturday December 10 2019 11:08:32 EDT Ventricular Rate:  85 PR Interval:  152 QRS Duration: 90 QT Interval:  362 QTC Calculation: 430 R Axis:   -22 Text Interpretation: Normal sinus rhythm Nonspecific ST abnormality No significant change since last tracing Confirmed by Blanchie Dessert [phone]) on 12/10/2019 1:28:13 PM   Radiology DG Chest 2 View  Result Date: 12/10/2019 CLINICAL DATA:  Left-sided chest and arm pain beginning this morning. EXAM: CHEST - 2 VIEW COMPARISON:  12/15/2013 FINDINGS: The heart size and mediastinal contours are within normal limits. Both lungs are clear. The visualized skeletal structures are unremarkable. IMPRESSION: No active cardiopulmonary disease. Electronically Signed   By: Marlaine Hind M.D.   On: 12/10/2019 11:45    Procedures Procedures (including critical care time)  Medications Ordered in ED Medications  sodium chloride flush (NS) 0.9 % injection 3 mL (has no administration in time range)    ED Course  I have reviewed the triage vital signs and the nursing notes.  Pertinent labs & imaging results that were available during my care of the patient were reviewed by me and considered in my medical decision making (see chart for details).    MDM Rules/Calculators/A&P                          Elderly gentleman who is presenting today with complaint of chest pain.  Patient is a heart score of 5.  Patient's story seems more atypical as his symptoms started while sleeping and was a burning sensation in the center of his chest.  It resolved after Maalox but has been intermittent.  It is not  exertional he does not have associated symptoms and is well-appearing on exam.  Patient reports he has had multiple episodes like this in the past all with negative cardiac work-ups including negative stress test and catheterization however most recent testing was approximately 6 years ago.  Patient's EKG today is unchanged from prior, delta troponin is 6 and 8.  Patient's BMP without significant abnormality except for hyperglycemia but has a known history of diabetes and CBC and chest x-ray without acute findings.  Low suspicion for PE or dissection at this time.  Lower suspicion for ACS at this time despite his higher heart score.  With unchanged EKG, negative troponins and symptoms that sound more likely related to GI origin feel that patient is stable for discharge home and follow-up as an outpatient.  Patient does take ibuprofen on a regular basis and with history of hiatal hernia suspect that is the cause of his symptoms.  Encourage patient to stop using ibuprofen and use Tylenol as needed.  Also reported if patient started developing shortness of breath or exertional symptoms he would need to return to the emergency room.  Otherwise he should follow-up with his PCP and may need stress testing in the future.  Final Clinical Impression(s) / ED Diagnoses Final diagnoses:  Nonspecific chest pain  Gastroesophageal reflux disease with esophagitis without hemorrhage    Rx / DC Orders ED Discharge Orders    None       Blanchie Dessert, MD 12/10/19 1521

## 2019-12-10 NOTE — Discharge Instructions (Signed)
Avoid using any more ibuprofen but you can use Tylenol as needed.  Also start taking your Prilosec every day for the next 2 weeks.  You can still use Maalox as needed.  If you start developing shortness of breath, fatigue with exercise or a change in the pain you should be evaluated.  You may need a new stress test scheduled as an outpatient since your other one was done 6 years ago but no need for admission today with normal labs.

## 2019-12-27 ENCOUNTER — Ambulatory Visit (INDEPENDENT_AMBULATORY_CARE_PROVIDER_SITE_OTHER): Payer: Medicare Other | Admitting: Family Medicine

## 2019-12-27 ENCOUNTER — Other Ambulatory Visit: Payer: Self-pay

## 2019-12-27 ENCOUNTER — Encounter: Payer: Self-pay | Admitting: Family Medicine

## 2019-12-27 VITALS — BP 112/62 | HR 80 | Temp 98.2°F | Ht 70.0 in | Wt 194.2 lb

## 2019-12-27 DIAGNOSIS — E781 Pure hyperglyceridemia: Secondary | ICD-10-CM

## 2019-12-27 DIAGNOSIS — J3489 Other specified disorders of nose and nasal sinuses: Secondary | ICD-10-CM

## 2019-12-27 DIAGNOSIS — R0789 Other chest pain: Secondary | ICD-10-CM | POA: Diagnosis not present

## 2019-12-27 MED ORDER — PRAVASTATIN SODIUM 10 MG PO TABS: 10.0000 mg | ORAL_TABLET | Freq: Every day | ORAL | 2 refills | Status: DC

## 2019-12-27 MED ORDER — ONETOUCH VERIO VI STRP: ORAL_STRIP | 3 refills | Status: DC

## 2019-12-27 MED ORDER — METHYLPREDNISOLONE 4 MG PO TBPK: ORAL_TABLET | ORAL | 0 refills | Status: DC

## 2019-12-27 NOTE — Addendum Note (Signed)
Addended by: Sharon Seller B on: 12/27/2019 01:49 PM   Modules accepted: Orders

## 2019-12-27 NOTE — Addendum Note (Signed)
Addended by: Ames Coupe on: 12/27/2019 10:02 AM   Modules accepted: Orders, Level of Service

## 2019-12-27 NOTE — Patient Instructions (Addendum)
Bring your blood pressure monitor to your next appointment to help verify the accuracy.  Keep the diet clean and stay active.  Avoid NSAIDs when able.   No need to take aspirin.   Let us know if you need anything.

## 2019-12-27 NOTE — Progress Notes (Addendum)
Chief Complaint  Patient presents with  . Hospitalization Follow-up  . Sinusitis    Subjective: Patient is a 74 y.o. male here for ED f/u.  Dx'd w atypical chest pain in ED, thought to be related to GERD/hiatal hernia. He reports having felt fine since he left the ED. He has cut down on NSAID consumption.   Pt had to stop Lipitor 2/2 joint pains. They resolved once he stopped. Has not tried another statin.  Sinus pressure since coming back from [place].    Past Medical History:  Diagnosis Date  . Degenerative joint disease   . Diabetes mellitus type 2, noninsulin dependent ([place])   . GERD (gastroesophageal reflux disease)   . Hiatal hernia   . Hx of urethral stricture   . Hypertriglyceridemia   . Personal history of skin cancer     Objective: BP 112/62 (BP Location: Right Arm, Patient Position: Sitting, Cuff Size: Normal)   Pulse 80   Temp 98.2 F (36.8 C) (Oral)   Ht 5\' 10"  (1.778 m)   Wt 194 lb 4 oz (88.1 kg)   SpO2 97%   BMI 27.87 kg/m  General: Awake, appears stated age Abd: BS+, S, ND, NT, no m/o Heart: RRR, no bruits, no LE edema Lungs: CTAB, no rales, wheezes or rhonchi. No accessory muscle use Psych: Age appropriate judgment and insight, normal affect and mood  Assessment and Plan: Atypical chest pain  Hypertriglyceridemia - Plan: pravastatin (PRAVACHOL) 10 MG tablet  Sinus pressure - Plan: methylPREDNISolone (MEDROL DOSEPAK) 4 MG TBPK tablet  1- resolved. 2- change Lipitor to pravastatin.  3- Medrol dosepak. INCS for future prevention.  F/u as originally scheduled or prn.  The patient voiced understanding and agreement to the plan.  [place], DO 12/27/19  10:01 AM

## 2020-01-23 ENCOUNTER — Telehealth: Payer: Self-pay | Admitting: Family Medicine

## 2020-01-23 MED ORDER — DOXYCYCLINE HYCLATE 100 MG PO TABS
100.0000 mg | ORAL_TABLET | Freq: Two times a day (BID) | ORAL | 0 refills | Status: AC
Start: 2020-01-23 — End: 2020-01-30

## 2020-01-23 MED ORDER — LEVOCETIRIZINE DIHYDROCHLORIDE 5 MG PO TABS
5.0000 mg | ORAL_TABLET | Freq: Every evening | ORAL | 2 refills | Status: DC
Start: 2020-01-23 — End: 2020-12-31

## 2020-01-23 NOTE — Telephone Encounter (Signed)
CallerWillam Munford  Call Back # [phone]  Patient states seen [name] a few ago about sinus issue, patient states prescribed a medication and it has not clear up his sinus issue. Patient would like a new medication sent to Pharmacy

## 2020-03-23 ENCOUNTER — Other Ambulatory Visit: Payer: Self-pay | Admitting: Family Medicine

## 2020-03-23 DIAGNOSIS — E781 Pure hyperglyceridemia: Secondary | ICD-10-CM

## 2020-07-09 ENCOUNTER — Telehealth: Payer: Self-pay | Admitting: Family Medicine

## 2020-07-09 NOTE — Telephone Encounter (Signed)
Schedule mychart video visit for tomorrow

## 2020-07-09 NOTE — Telephone Encounter (Signed)
Cont supportive care, sched tomorrow plz. Ty.

## 2020-07-09 NOTE — Telephone Encounter (Signed)
Caller : Mavrick Mcquigg  Call Back @ [phone]  Patient's daughter called in reference to patient medical condition, patient has covid 19, patient has rash and cough. Not getting any better   Please advise

## 2020-07-10 ENCOUNTER — Other Ambulatory Visit: Payer: Self-pay

## 2020-07-10 ENCOUNTER — Telehealth (INDEPENDENT_AMBULATORY_CARE_PROVIDER_SITE_OTHER): Payer: Medicare Other | Admitting: Family Medicine

## 2020-07-10 ENCOUNTER — Encounter: Payer: Self-pay | Admitting: Family Medicine

## 2020-07-10 VITALS — Temp 98.8°F

## 2020-07-10 DIAGNOSIS — U071 COVID-19: Secondary | ICD-10-CM | POA: Diagnosis not present

## 2020-07-10 MED ORDER — BENZONATATE 100 MG PO CAPS: 100.0000 mg | ORAL_CAPSULE | Freq: Three times a day (TID) | ORAL | 0 refills | Status: DC | PRN

## 2020-07-10 NOTE — Progress Notes (Signed)
Chief Complaint  Patient presents with  . Cough    [facility] [place] [facility]    Jeffrey Dodson here for URI complaints. Due to COVID-19 pandemic, we are interacting via web portal for an electronic face-to-face visit. I verified patient's ID using 2 identifiers. Patient agreed to proceed with visit via this method. Patient is at home, I am at office. Patient and I are present for visit.   Duration: 11 days  Associated symptoms: cough, rash on back Denies: sinus congestion, sinus pain, rhinorrhea, itchy watery eyes, ear pain, ear drainage, sore throat, wheezing, shortness of breath, myalgia and fevers, N/V/D Treatment to date: HC cream on back that did help; robitussin Sick contacts: Yes- family members with covid He is vaccinated, not boosted.   Past Medical History:  Diagnosis Date  . Degenerative joint disease   . Diabetes mellitus type 2, noninsulin dependent ([place])   . GERD (gastroesophageal reflux disease)   . Hiatal hernia   . Hx of urethral stricture   . Hypertriglyceridemia   . Personal history of skin cancer    Objective Temp 98.8 F (37.1 C) (Oral)  No conversational dyspnea Age appropriate judgment and insight Nml affect and mood  COVID-19 - Plan: benzonatate (TESSALON) 100 MG capsule  I think he is turning the corner. Will send in prn med for coughing.  Continue to push fluids, practice good hand hygiene, cover mouth when coughing. F/u prn. If starting to experience irreplaceable fluid loss, shaking, or shortness of breath, seek immediate care. Pt and his family members voiced understanding and agreement to the plan.  [place], DO 07/10/20 1:07 PM

## 2020-11-27 ENCOUNTER — Other Ambulatory Visit: Payer: Self-pay

## 2020-11-27 ENCOUNTER — Ambulatory Visit (INDEPENDENT_AMBULATORY_CARE_PROVIDER_SITE_OTHER): Payer: Medicare Other | Admitting: Family Medicine

## 2020-11-27 ENCOUNTER — Encounter: Payer: Self-pay | Admitting: Family Medicine

## 2020-11-27 VITALS — BP 142/86 | HR 71 | Temp 98.2°F | Ht 70.0 in | Wt 196.5 lb

## 2020-11-27 DIAGNOSIS — J3489 Other specified disorders of nose and nasal sinuses: Secondary | ICD-10-CM

## 2020-11-27 DIAGNOSIS — R030 Elevated blood-pressure reading, without diagnosis of hypertension: Secondary | ICD-10-CM | POA: Diagnosis not present

## 2020-11-27 DIAGNOSIS — R0789 Other chest pain: Secondary | ICD-10-CM | POA: Diagnosis not present

## 2020-11-27 MED ORDER — PREDNISONE 20 MG PO TABS: 40.0000 mg | ORAL_TABLET | Freq: Every day | ORAL | 0 refills | Status: AC

## 2020-11-27 NOTE — Progress Notes (Signed)
Chief Complaint  Patient presents with   Hypertension    Jeffrey Dodson here for URI complaints.  Duration: 1 month  Associated symptoms: sinus congestion, sinus pain, dizziness, and rhinorrhea Denies: itchy watery eyes, ear pain, ear drainage, sore throat, wheezing, shortness of breath, myalgia, and fevers Treatment to date: Flonase, Zyrtec Sick contacts: No  Elevated blood pressure Patient presents for elevated blood pressure. He does monitor home blood pressures. Blood pressures ranging on average from 150-160's/80-90's. He is not on any medications. Wonders if it is related to above.  He is adhering to a healthy diet overall. Exercise: walking  He also mentions some right upper chest discomfort.  It comes and goes it is nonexertional.  He has had an extensive work-up for coronary artery disease in his past.  Exam and unremarkable thus far.  No specific injury.  He did go fishing and was casting his rod more frequently with his right arm.  Past Medical History:  Diagnosis Date   Degenerative joint disease    Diabetes mellitus type 2, noninsulin dependent (HCC)    GERD (gastroesophageal reflux disease)    Hiatal hernia    Hx of urethral stricture    Hypertriglyceridemia    Personal history of skin cancer     BP (!) 142/86   Pulse 71   Temp 98.2 F (36.8 C) (Oral)   Ht 5\' 10"  (1.778 m)   Wt 196 lb 8 oz (89.1 kg)   SpO2 97%   BMI 28.19 kg/m  General: Awake, alert, appears stated age HEENT: AT, [place], ears patent b/l and TM's neg, nares patent w/o discharge, pharynx pink and without exudates, MMM Neck: No masses or asymmetry Heart: RRR Lungs: CTAB, no accessory muscle use Psych: Age appropriate judgment and insight, normal mood and affect  Sinus pressure - Plan: predniSONE (DELTASONE) 20 MG tablet  Elevated blood pressure reading  Chest wall pain  This is probably allergic mediated.  He has done well with prednisone in the past.  When this happens in the future,  he will start using Flonase and Zyrtec right away. Continue to push fluids, practice good hand hygiene, cover mouth when coughing. F/u prn. If starting to experience fevers, shaking, or shortness of breath, seek immediate care. He will continue to monitor his blood pressure at home.  I will see him in a month in the care of his diabetes and he will show me his readings at that point.  We will hold off on any medication in the meanwhile. I gave him some stretches and exercises for his upper chest wall discomfort.  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  PT if no better by next month. Pt voiced understanding and agreement to the plan.  Greater than 30 minutes were spent with the patient in addition to reviewing their chart information on the same day of the visit.   [place], DO 11/27/20 4:48 PM

## 2020-11-27 NOTE — Patient Instructions (Addendum)
When you start getting sinus pressure again in the future, start using Flonase and Zyrtec daily.   Sparing use of ibuprofen is fine.  Check your blood pressures 2-3 times per week, alternating the time of day you check it. If it is high, considering waiting 1-2 minutes and rechecking. If it gets higher, your anxiety is likely creeping up and we should avoid rechecking. I want your blood pressure less than 150/90 consistently. If it is higher than this after the sinus issue resolves, I want to see you.  Let us know if you need anything.  Pectoralis Major Rehab Ask your health care provider which exercises are safe for you. Do exercises exactly as told by your health care provider and adjust them as directed. It is normal to feel mild stretching, pulling, tightness, or discomfort as you do these exercises, but you should stop right away if you feel sudden pain or your pain gets worse. Do not begin these exercises until told by your health care provider. Stretching and range of motion exercises These exercises warm up your muscles and joints and improve the movement and flexibility of your shoulder. These exercises can also help to relieve pain, numbness, and tingling. Exercise A: Pendulum  Stand near a wall or a surface that you can hold onto for balance. Bend at the waist and let your left / right arm hang straight down. Use your other arm to keep your balance. Relax your arm and shoulder muscles, and move your hips and your trunk so your left / right arm swings freely. Your arm should swing because of the motion of your body, not because you are using your arm or shoulder muscles. Keep moving so your arm swings in the following directions, as told by your health care provider: Side to side. Forward and backward. In clockwise and counterclockwise circles. Slowly return to the starting position. Repeat 2 times. Complete this exercise 3 times per week. Exercise B: Abduction, standing Stand and  hold a broomstick, a cane, or a similar object. Place your hands a little more than shoulder-width apart on the object. Your left / right hand should be palm-up, and your other hand should be palm-down. While keeping your elbow straight and your shoulder muscles relaxed, push the stick across your body toward your left / right side. Raise your left / right arm to the side of your body and then over your head until you feel a stretch in your shoulder. Stop when you reach the angle that is recommended by your health care provider. Avoid shrugging your shoulder while you raise your arm. Keep your shoulder blade tucked down toward the middle of your spine. Hold for 10 seconds. Slowly return to the starting position. Repeat 2 times. Complete this exercise 3 times per week. Exercise C: Wand flexion, supine  Lie on your back. You may bend your knees for comfort. Hold a broomstick, a cane, or a similar object so that your hands are about shoulder-width apart on the object. Your palms should face toward your feet. Raise your left / right arm in front of your face, then behind your head (toward the floor). Use your other hand to help you do this. Stop when you feel a gentle stretch in your shoulder, or when you reach the angle that is recommended by your health care provider. Hold for 3 seconds. Use the broomstick and your other arm to help you return your left / right arm to the starting position. Repeat 2 times.  Complete this exercise 3 times per week. Exercise D: Wand shoulder external rotation Stand and hold a broomstick, a cane, or a similar object so your hands are about shoulder-width apart on the object. Start with your arms hanging down, then bend both elbows to an "L" shape (90 degrees). Keep your left / right elbow at your side. Use your other hand to push the stick so your left / right forearm moves away from your body, out to your side. Keep your left / right elbow bent to 90 degrees and keep  it against your side. Stop when you feel a gentle stretch in your shoulder, or when you reach the angle recommended by your health care provider. Hold for 10 seconds. Use the stick to help you return your left / right arm to the starting position. Repeat 2 times. Complete this exercise 3 times per week. Strengthening exercises These exercises build strength and endurance in your shoulder. Endurance is the ability to use your muscles for a long time, even after your muscles get tired. Exercise E: Scapular protraction, standing Stand so you are facing a wall. Place your feet about one arm-length away from the wall. Place your hands on the wall and straighten your elbows. Keep your hands on the wall as you push your upper back away from the wall. You should feel your shoulder blades sliding forward. Keep your elbows and your head still. If you are not sure that you are doing this exercise correctly, ask your health care provider for more instructions. Hold for 3 seconds. Slowly return to the starting position. Let your muscles relax completely before you repeat this exercise. Repeat 2 times. Complete this exercise 3 times per week. Exercise F: Shoulder blade squeezes  (scapular retraction) Sit with good posture in a stable chair. Do not let your back touch the back of the chair. Your arms should be at your sides with your elbows bent. You may rest your forearms on a pillow if that is more comfortable. Squeeze your shoulder blades together. Bring them down and back. Keep your shoulders level. Do not lift your shoulders up toward your ears. Hold for 3 seconds. Return to the starting position. Repeat 2 times. Complete this exercise 3 times per week. This information is not intended to replace advice given to you by your health care provider. Make sure you discuss any questions you have with your health care provider. Document Released: 06/02/2005 Document Revised: 03/13/2016 Document Reviewed:  02/18/2015 Elsevier Interactive Patient Education  [employer].

## 2020-12-11 ENCOUNTER — Emergency Department (HOSPITAL_BASED_OUTPATIENT_CLINIC_OR_DEPARTMENT_OTHER)
Admission: EM | Admit: 2020-12-11 | Discharge: 2020-12-11 | Disposition: A | Discharge status: Home / Self Care | Payer: Medicare Other | Attending: Emergency Medicine | Admitting: Emergency Medicine

## 2020-12-11 ENCOUNTER — Other Ambulatory Visit: Payer: Self-pay

## 2020-12-11 ENCOUNTER — Telehealth: Payer: Self-pay

## 2020-12-11 ENCOUNTER — Encounter (HOSPITAL_BASED_OUTPATIENT_CLINIC_OR_DEPARTMENT_OTHER): Payer: Self-pay | Admitting: *Deleted

## 2020-12-11 DIAGNOSIS — E1165 Type 2 diabetes mellitus with hyperglycemia: Secondary | ICD-10-CM | POA: Diagnosis not present

## 2020-12-11 DIAGNOSIS — R519 Headache, unspecified: Secondary | ICD-10-CM | POA: Diagnosis present

## 2020-12-11 DIAGNOSIS — J019 Acute sinusitis, unspecified: Secondary | ICD-10-CM | POA: Insufficient documentation

## 2020-12-11 DIAGNOSIS — R739 Hyperglycemia, unspecified: Secondary | ICD-10-CM

## 2020-12-11 DIAGNOSIS — Z85828 Personal history of other malignant neoplasm of skin: Secondary | ICD-10-CM | POA: Insufficient documentation

## 2020-12-11 DIAGNOSIS — R42 Dizziness and giddiness: Secondary | ICD-10-CM | POA: Insufficient documentation

## 2020-12-11 DIAGNOSIS — J3489 Other specified disorders of nose and nasal sinuses: Secondary | ICD-10-CM

## 2020-12-11 LAB — URINALYSIS, ROUTINE W REFLEX MICROSCOPIC
Bilirubin Urine: NEGATIVE
Glucose, UA: 250 mg/dL — AB
Hgb urine dipstick: NEGATIVE
Ketones, ur: NEGATIVE mg/dL
Leukocytes,Ua: NEGATIVE
Nitrite: NEGATIVE
Protein, ur: NEGATIVE mg/dL
Specific Gravity, Urine: >1.03 — ABNORMAL HIGH (ref 1.005–1.030)
pH: 5.5 (ref 5.0–8.0)

## 2020-12-11 LAB — BASIC METABOLIC PANEL
Anion gap: 9 (ref 5–15)
BUN: 17 mg/dL (ref 8–23)
CO2: 23 mmol/L (ref 22–32)
Calcium: 9.4 mg/dL (ref 8.9–10.3)
Chloride: 104 mmol/L (ref 98–111)
Creatinine, Ser: 1.35 mg/dL — ABNORMAL HIGH (ref 0.61–1.24)
GFR, Estimated: 56 mL/min — ABNORMAL LOW (ref >60)
Glucose, Bld: 146 mg/dL — ABNORMAL HIGH (ref 70–99)
Potassium: 4.3 mmol/L (ref 3.5–5.1)
Sodium: 136 mmol/L (ref 135–145)

## 2020-12-11 LAB — CBG MONITORING, ED: Glucose-Capillary: 205 mg/dL — ABNORMAL HIGH (ref 70–99)

## 2020-12-11 LAB — CBC
HCT: 45 % (ref 39.0–52.0)
Hemoglobin: 15.7 g/dL (ref 13.0–17.0)
MCH: 30 pg (ref 26.0–34.0)
MCHC: 34.9 g/dL (ref 30.0–36.0)
MCV: 86 fL (ref 80.0–100.0)
Platelets: 169 10*3/uL (ref 150–400)
RBC: 5.23 MIL/uL (ref 4.22–5.81)
RDW: 12.2 % (ref 11.5–15.5)
WBC: 10.5 10*3/uL (ref 4.0–10.5)
nRBC: 0 % (ref 0.0–0.2)

## 2020-12-11 MED ORDER — DOXYCYCLINE HYCLATE 100 MG PO CAPS
100.0000 mg | ORAL_CAPSULE | Freq: Two times a day (BID) | ORAL | 0 refills | Status: AC
Start: 2020-12-11 — End: 2020-12-18

## 2020-12-11 MED ORDER — DOXYCYCLINE HYCLATE 100 MG PO TABS
100.0000 mg | ORAL_TABLET | Freq: Once | ORAL | Status: AC
  Administered 2020-12-11: 100 mg via ORAL
  Filled 2020-12-11: qty 1

## 2020-12-11 NOTE — ED Provider Notes (Signed)
[facility] EMERGENCY DEPARTMENT Provider Note   CSN: [id number] Arrival date & time: 12/11/20  1232     History Chief Complaint  Patient presents with   Hyperglycemia    Jeffrey Dodson is a 75 y.o. male with a history of borderline diabetes (not on medications), recurring sinus infections, presented ED with frontal headache and hyperglycemia.  Patient reports that he has had a frontal headache which she thinks is sinus infection for the past 4 weeks.  He was seen by his doctor and started on 5 days of prednisone, which he completed approximately 8 days ago.  He did have some improvement of his headache and sinus pain, but then recurred.  He was concerned because his blood sugars been higher than normal this week.  Typically his blood sugars between 150 and 160.  This week it has been over 200.  He is not on insulin or diabetes medications.  He is on "diet-controlled diabetes plan" per his report.   He has been using for the past 4 days Zyrtec and Flonase.  He continues to have persistent frontal headache, some lightheadedness, which she says are his recurring symptoms.  He said similar episodes many times in the past.  He says typically he needs a course of antibiotics which resolved this.  He denies any history of daily headaches, or any other neurological symptoms.  HPI     Past Medical History:  Diagnosis Date   Degenerative joint disease    Diabetes mellitus type 2, noninsulin dependent (HCC)    GERD (gastroesophageal reflux disease)    Hiatal hernia    Hx of urethral stricture    Hypertriglyceridemia    Personal history of skin cancer     Patient Active Problem List   Diagnosis Date Noted   Hypertriglyceridemia    Diabetes mellitus type 2 in obese ([place])    Atypical chest pain 12/25/2013   Abnormal blood sugar 12/14/2013   Other fatigue 12/14/2013   HYPERCHOLESTEROLEMIA, BORDERLINE 02/19/2008   URETHRAL STRICTURE 02/19/2008   DEGENERATIVE JOINT DISEASE  02/19/2008   CHEST PAIN, ATYPICAL 02/19/2008   SKIN CANCER, HX OF 02/19/2008   GERD 02/16/2008   HIATAL HERNIA 02/16/2008   DIVERTICULOSIS OF COLON 02/16/2008    Past Surgical History:  Procedure Laterality Date   S/P urethrotomy  11/05   Dr Rosana Hoes       Family History  Problem Relation Age of Onset   Deep vein thrombosis Father 53       Deceased   Other Father        PTE   Hypertension Mother    Congestive Heart Failure Other    Diabetes Mellitus II Other    Cirrhosis Brother        Liver, deceased   Stroke Sister    Diabetes Sister    COPD Sister     Social History   Tobacco Use   Smoking status: Never   Smokeless tobacco: Never  Vaping Use   Vaping Use: Never used  Substance Use Topics   Alcohol use: No    Alcohol/week: 0.0 standard drinks   Drug use: No    Home Medications Prior to Admission medications   Medication Sig Start Date End Date Taking? Authorizing Provider  doxycycline (VIBRAMYCIN) 100 MG capsule Take 1 capsule (100 mg total) by mouth 2 (two) times daily for 7 days. 12/11/20 12/18/20 Yes Naly Schwanz, Carola Rhine, MD  Blood Glucose Monitoring Suppl [facility] VERIO) w/Device KIT Use three times per  week to check blood sugar.  E11.9 04/07/18   Shelda Pal, DO  glucose blood (ONETOUCH VERIO) test strip Use three times a week to check blood sugar.  E11.9 12/27/19   Shelda Pal, DO  KRILL OIL PO Take by mouth.    [provider]  levocetirizine (XYZAL) 5 MG tablet Take 1 tablet (5 mg total) by mouth every evening. 01/23/20   Shelda Pal, DO  ONE TOUCH LANCETS MISC Use to check blood sugar 3 times per week.  E11.9 04/07/18   Shelda Pal, DO  pravastatin (PRAVACHOL) 10 MG tablet TAKE 1 TABLET BY MOUTH EVERY DAY 03/23/20   Shelda Pal, DO    Allergies    Patient has no known allergies.  Review of Systems   Review of Systems  Constitutional:  Negative for chills and fever.  HENT:  Positive for  congestion and sinus pressure. Negative for ear pain and sore throat.   Eyes:  Negative for pain and visual disturbance.  Respiratory:  Negative for cough and shortness of breath.   Cardiovascular:  Negative for chest pain and palpitations.  Gastrointestinal:  Negative for abdominal pain and vomiting.  Genitourinary:  Positive for frequency. Negative for dysuria and hematuria.  Musculoskeletal:  Negative for arthralgias and back pain.  Skin:  Negative for color change and rash.  Neurological:  Positive for light-headedness and headaches. Negative for syncope, weakness and numbness.  All other systems reviewed and are negative.  Physical Exam Updated Vital Signs BP (!) 144/97 (BP Location: Right Arm)   Pulse (!) 50   Temp 98.3 F (36.8 C) (Oral)   Resp 14   Ht 5' 10"  (1.778 m)   Wt 89.1 kg   SpO2 98%   BMI 28.18 kg/m   Physical Exam Constitutional:      General: He is not in acute distress. HENT:     Head: Normocephalic and atraumatic.  Eyes:     Conjunctiva/sclera: Conjunctivae normal.     Pupils: Pupils are equal, round, and reactive to light.  Cardiovascular:     Rate and Rhythm: Normal rate and regular rhythm.  Pulmonary:     Effort: Pulmonary effort is normal. No respiratory distress.  Abdominal:     General: There is no distension.     Tenderness: There is no abdominal tenderness.  Skin:    General: Skin is warm and dry.  Neurological:     General: No focal deficit present.     Mental Status: He is alert and oriented to person, place, and time. Mental status is at baseline.     Sensory: No sensory deficit.     Motor: No weakness.  Psychiatric:        Mood and Affect: Mood normal.        Behavior: Behavior normal.    ED Results / Procedures / Treatments   Labs (all labs ordered are listed, but only abnormal results are displayed) Labs Reviewed  BASIC METABOLIC PANEL - Abnormal; Notable for the following components:      Result Value   Glucose, Bld 146 (*)     Creatinine, Ser 1.35 (*)    GFR, Estimated 56 (*)    All other components within normal limits  URINALYSIS, ROUTINE W REFLEX MICROSCOPIC - Abnormal; Notable for the following components:   Specific Gravity, Urine >1.030 (*)    Glucose, UA 250 (*)    All other components within normal limits  CBG MONITORING, ED - Abnormal; Notable  for the following components:   Glucose-Capillary 205 (*)    All other components within normal limits  CBC    EKG None  Radiology No results found.  Procedures Procedures   Medications Ordered in ED Medications  doxycycline (VIBRA-TABS) tablet 100 mg (100 mg Oral Given 12/11/20 1404)    ED Course  I have reviewed the triage vital signs and the nursing notes.  Pertinent labs & imaging results that were available during my care of the patient were reviewed by me and considered in my medical decision making (see chart for details).  Ddx includes sinusitis vs frontal headache vs other I doubt this is tumor or intracranial bleed with no traumatic mechanism, and no persistent daily headache symptoms.  He has a benign neuro exam.  Because is been ongoing for a month, I do think it is reasonable to treat him with a course of antibiotics.  He does report improvement after antibiotics in the past.  I explained to him that this could be completely coincidental, but I am willing to try a course of doxycycline today.  I advised that he continue the cetirizine but he stop the Flonase, as he has already been on this for 4 days, and is concerned about high blood pressure.  He had some urinary frequency last week while he was on the steroids.  His UA here does show a high specific gravity.  I advised that he continue drinking plenty of water at home.  He has been off the steroids for several days and his sugars appear to have drifted back down to normal.  And will think he needs to be on insulin.  His labs were not consistent with DKA.  He can keep himself orally  hydrated at home.  He can follow-up with his PCP   Clinical Course as of 12/11/20 1453  Tue Dec 11, 2020  1431 Labs reviewed and near baseline.  His creatinine is near baseline.  There is no anion gap.  He does have some glucose in the urine, but no ketones.  Doubt DKA.  I will encourage him he needs to drink more water.  Glucose is 146 on BMP - does not need emergent insulin here. Okay for discharge [MT]    Clinical Course User Index [MT] Wyvonnia Dusky, MD    Final Clinical Impression(s) / ED Diagnoses Final diagnoses:  Sinus pressure  High blood sugar    Rx / DC Orders ED Discharge Orders          Ordered    doxycycline (VIBRAMYCIN) 100 MG capsule  2 times daily        12/11/20 1440             Wyvonnia Dusky, MD 12/11/20 1453

## 2020-12-11 NOTE — Telephone Encounter (Signed)
Looks like he has f/u with me on 7/13. Nothing urgent though. Ty.

## 2020-12-11 NOTE — Telephone Encounter (Signed)
urse Assessment Nurse: Fredderick Phenix, RN, Lelan Pons Date/Time Eilene Ghazi Time): 12/11/2020 11:04:26 AM Confirm and document reason for call. If symptomatic, describe symptoms. ---Caller states that her husbands blood sugar is currently 264, he has not been diagnosed as diabetic but he would like to be seen to have that checked by the dr as he is still dizzy as well. Does the patient have any new or worsening symptoms? ---Yes Will a triage be completed? ---Yes Related visit to physician within the last 2 weeks? ---Yes Does the PT have any chronic conditions? (i.e. diabetes, asthma, this includes High risk factors for pregnancy, etc.) ---Yes List chronic conditions. ---HTN, hiatal hernia, GERD Is this a behavioral health or substance abuse call? ---No Guidelines Guideline Title Affirmed Question Affirmed Notes Nurse Date/Time (Eastern Time) Diabetes - High Blood Sugar [1] Blood glucose 240 - 300 mg/dL (13.3 - 16.7 mmol/ Fredderick Phenix, RN, Lelan Pons 12/11/2020 11:08:14 AM PLEASE NOTE: All timestamps contained within this report are represented as [place] Standard Time. CONFIDENTIALTY NOTICE: This fax transmission is intended only for the addressee. It contains information that is legally privileged, confidential or otherwise protected from use or disclosure. If you are not the intended recipient, you are strictly prohibited from reviewing, disclosing, copying using or disseminating any of this information or taking any action in reliance on or regarding this information. If you have received this fax in error, please notify us immediately by telephone so that we can arrange for its return to [place]. Phone: [phone], Toll-Free: [phone], Fax: [phone] Page: 2 of 2 Call Id: [id number] Guidelines Guideline Title Affirmed Question Affirmed Notes Nurse Date/Time Eilene Ghazi Time) L) AND [2] does not use insulin (e.g., not insulin-dependent; most people with type 2 diabetes) Dizziness  - Lightheadedness SEVERE dizziness (e.g., unable to stand, requires support to walk, feels like passing out now) Fredderick Phenix, RN, Lelan Pons 12/11/2020 11:11:28 AM Disp. Time Eilene Ghazi Time) Disposition Final User 12/11/2020 11:11:12 [place], RN, Lelan Pons 12/11/2020 11:17:46 AM Go to ED Now (or PCP triage) Yes Fredderick Phenix, RN, [place] Disagree/Comply Comply Caller Understands Yes PreDisposition Did not know what to do Care Advice Given Per Guideline HOME CARE: * You should be able to treat this at home. HIGH BLOOD SUGAR (HYPERGLYCEMIA): * Symptoms of mildly high blood sugar: Frequent urination (peeing), increased thirst, fatigue, blurred vision. CARE ADVICE given per Diabetes - High Blood Sugar (Adult) guideline. GO TO ED NOW (OR PCP TRIAGE): * IF NO PCP (PRIMARY CARE PROVIDER) SECOND-LEVEL TRIAGE: You need to be seen within the next hour. Go to the [facility] at _[address] [place] as soon as you can. ANOTHER ADULT SHOULD DRIVE: * It is better and safer if another adult drives instead of you. BRING MEDICINES: * Bring a list of your current medicines when you go to the Emergency Department (ER). CARE ADVICE given per Dizziness (Adult) guideline. Referrals GO TO FACILITY OTHER - SPECIFY

## 2020-12-11 NOTE — ED Triage Notes (Signed)
States he was treated for a sinus infection 2 weeks ago. Symptoms did not improve after tx. Here today with c.o elevated blood sugar of 256. He feels his glucometer is not accurate.

## 2020-12-11 NOTE — Telephone Encounter (Signed)
Pt's Spouse, Juliann Pulse, called and stated pt is still having some dizzyness and the prednisone pt was prescribed has not worked.  Pt's BP at times has been 154/86 or 87 and blood sugars have been 257/256.  Pt was offered first appt tomorrow morning due to a cancellation on PCP's schedule, but he had something else to do.  Pt sent to triage for further evaluation.

## 2020-12-11 NOTE — Discharge Instructions (Addendum)
Please drink plenty of water at home.  Stop the flonase.  You can continue the Zyrtec.  I prescribed a course of doxycycline to treat for possible sinus infection.

## 2020-12-11 NOTE — ED Notes (Signed)
Pt denies any symptoms of hyperglycemia. States he normally runs 180 and below. Was recently on prednisone for sinus infection. States still having the sinus pressure and headache.

## 2020-12-26 ENCOUNTER — Ambulatory Visit (INDEPENDENT_AMBULATORY_CARE_PROVIDER_SITE_OTHER): Payer: Medicare Other | Admitting: Family Medicine

## 2020-12-26 ENCOUNTER — Encounter: Payer: Self-pay | Admitting: Family Medicine

## 2020-12-26 VITALS — BP 134/76 | HR 65 | Temp 98.1°F | Ht 70.0 in | Wt 192.4 lb

## 2020-12-26 DIAGNOSIS — E781 Pure hyperglyceridemia: Secondary | ICD-10-CM

## 2020-12-26 DIAGNOSIS — E669 Obesity, unspecified: Secondary | ICD-10-CM

## 2020-12-26 DIAGNOSIS — E1169 Type 2 diabetes mellitus with other specified complication: Secondary | ICD-10-CM | POA: Diagnosis not present

## 2020-12-26 LAB — MICROALBUMIN / CREATININE URINE RATIO
Creatinine,U: 171.4 mg/dL
Microalb Creat Ratio: 1 mg/g (ref 0.0–30.0)
Microalb, Ur: 1.7 mg/dL (ref 0.0–1.9)

## 2020-12-26 NOTE — Patient Instructions (Addendum)
Give [place] 2-3 business days to get the results of your labs back. Our follow up will be based on your lab results.   Keep the diet clean and stay active.  Consider getting the covid vaccine booster.   Let us know if you need anything.

## 2020-12-26 NOTE — Progress Notes (Signed)
Subjective:   Chief Complaint  Patient presents with   Follow-up    Jeffrey Dodson is a 75 y.o. male here for follow-up of diabetes.   Jeffrey Dodson's self monitored glucose range is mid 100's.  Patient denies hypoglycemic reactions. He checks his glucose levels 1-2 time(s) per day. Patient does not require insulin.   Medications include: Diet controlled.  Diet is healthy.   Exercise: active in house and working No CP or SOB.   Hypertriglyceridemia Patient presents for hypertriglyceridemia follow up. Currently being treated with pravastatin 40 mg/d and compliance with treatment thus far has been good. He denies myalgias. Diet/exercise as above.  The patient is not known to have coexisting coronary artery disease.  Past Medical History:  Diagnosis Date   Degenerative joint disease    Diabetes mellitus type 2, noninsulin dependent (HCC)    GERD (gastroesophageal reflux disease)    Hiatal hernia    Hx of urethral stricture    Hypertriglyceridemia    Personal history of skin cancer      Related testing: Retinal exam: Done Pneumovax: done  Objective:  BP 134/76   Pulse 65   Temp 98.1 F (36.7 C) (Oral)   Ht 5\' 10"  (1.778 m)   Wt 192 lb 6 oz (87.3 kg)   SpO2 98%   BMI 27.60 kg/m  General:  Well developed, well nourished, in no apparent distress Skin:  Warm, no pallor or diaphoresis Head:  Normocephalic, atraumatic Eyes:  Pupils equal and round, sclera anicteric without injection  Lungs:  CTAB, no access msc use Cardio:  RRR, no bruits, no LE edema Musculoskeletal:  Symmetrical muscle groups noted without atrophy or deformity Neuro:  Sensation intact to pinprick on feet Psych: Age appropriate judgment and insight  Assessment:   Diabetes mellitus type 2 in obese ([place]) - Plan: Comprehensive metabolic panel, Lipid panel, Hemoglobin A1c, Microalbumin / creatinine urine ratio  Hypertriglyceridemia   Plan:   Chronic, probably stable. Cont statin and diet control. Ck  above. Due for eye exam. Counseled on diet and exercise. Chronic, stable. Cont pravastatin, ck labs.  F/u in 3-6 mo. The patient voiced understanding and agreement to the plan.  [place], DO 12/26/20 2:03 PM

## 2020-12-27 ENCOUNTER — Other Ambulatory Visit: Payer: Self-pay | Admitting: Family Medicine

## 2020-12-27 LAB — HEMOGLOBIN A1C: Hgb A1c MFr Bld: 8 % — ABNORMAL HIGH (ref 4.6–6.5)

## 2020-12-27 LAB — LIPID PANEL
Cholesterol: 178 mg/dL (ref 0–200)
HDL: 43.6 mg/dL (ref >39.00)
LDL Cholesterol: 98 mg/dL (ref 0–99)
NonHDL: 134.05
Total CHOL/HDL Ratio: 4
Triglycerides: 182 mg/dL — ABNORMAL HIGH (ref 0.0–149.0)
VLDL: 36.4 mg/dL (ref 0.0–40.0)

## 2020-12-27 LAB — COMPREHENSIVE METABOLIC PANEL
ALT: 28 U/L (ref 0–53)
AST: 30 U/L (ref 0–37)
Albumin: 4.7 g/dL (ref 3.5–5.2)
Alkaline Phosphatase: 55 U/L (ref 39–117)
BUN: 17 mg/dL (ref 6–23)
CO2: 25 mEq/L (ref 19–32)
Calcium: 9.6 mg/dL (ref 8.4–10.5)
Chloride: 106 mEq/L (ref 96–112)
Creatinine, Ser: 1.4 mg/dL (ref 0.40–1.50)
GFR: 50.08 mL/min — ABNORMAL LOW (ref >60.00)
Glucose, Bld: 82 mg/dL (ref 70–99)
Potassium: 4.4 mEq/L (ref 3.5–5.1)
Sodium: 140 mEq/L (ref 135–145)
Total Bilirubin: 0.8 mg/dL (ref 0.2–1.2)
Total Protein: 6.9 g/dL (ref 6.0–8.3)

## 2020-12-27 MED ORDER — METFORMIN HCL ER 500 MG PO TB24: 500.0000 mg | ORAL_TABLET | Freq: Every day | ORAL | 3 refills | Status: DC

## 2020-12-28 ENCOUNTER — Telehealth: Payer: Self-pay | Admitting: Family Medicine

## 2020-12-28 MED ORDER — ONETOUCH VERIO VI STRP: ORAL_STRIP | 3 refills | Status: DC

## 2020-12-28 NOTE — Telephone Encounter (Signed)
The patient had previously been testing a couple times a week. Should he increase his testing? Also BS running around 122----on the medication now hopefully will go lower. He needs to know what numbers are ok

## 2020-12-28 NOTE — Telephone Encounter (Signed)
<  150 in morning, <200 random, might need to update his monitor or have it calibrated because average of 122 does not equate an A1c of 8. Ty.

## 2020-12-28 NOTE — Telephone Encounter (Signed)
Called informed of PCP instructions. He has stopped eating sweets in the last 2 weeks. 3 weeks ago was 300 and then dropped to 160 to 180 But this week between 120 to 150. He thinks by changing his diet has brought the numbers down already. Patient had no other questions or concerns.

## 2020-12-28 NOTE — Telephone Encounter (Signed)
Would rec testing in the afternoons and evenings more after eating. 2-3 times per week is fine. Ty.

## 2020-12-28 NOTE — Telephone Encounter (Signed)
His average is around 122 and assumes it may go down since on medication. He would like to know what is the average you would like it to stay

## 2020-12-29 ENCOUNTER — Other Ambulatory Visit: Payer: Self-pay | Admitting: Family Medicine

## 2021-01-04 ENCOUNTER — Telehealth: Payer: Self-pay | Admitting: Family Medicine

## 2021-01-04 NOTE — Telephone Encounter (Signed)
Patient informed and verbalized understanding

## 2021-01-04 NOTE — Telephone Encounter (Signed)
Let's see how he does over the weekend if it was better, stop if it happens again though and let us know. Will send in something else. Ty.

## 2021-01-04 NOTE — Telephone Encounter (Signed)
The patient states he developed diarrhea after taking Metformin. For the past 3 days, he has been using the restroom about 3 times a day. However, today is much better. The patient reports that his blood sugar levels are between 115 and 120. Please advise.

## 2021-01-08 ENCOUNTER — Telehealth: Payer: Self-pay

## 2021-01-08 MED ORDER — PIOGLITAZONE HCL 30 MG PO TABS: 30.0000 mg | ORAL_TABLET | Freq: Every day | ORAL | 3 refills | Status: DC

## 2021-01-08 NOTE — Telephone Encounter (Signed)
Patient called to reports still having diarrhea from taking Metformin.  Please advise.

## 2021-01-08 NOTE — Telephone Encounter (Signed)
Called informed of PCP instructions. 

## 2021-01-08 NOTE — Telephone Encounter (Signed)
Stop, will send in another different med. Ty.

## 2021-02-25 ENCOUNTER — Other Ambulatory Visit: Payer: Self-pay

## 2021-02-25 ENCOUNTER — Encounter: Payer: Self-pay | Admitting: Family Medicine

## 2021-02-25 ENCOUNTER — Ambulatory Visit (INDEPENDENT_AMBULATORY_CARE_PROVIDER_SITE_OTHER): Payer: Medicare Other | Admitting: Family Medicine

## 2021-02-25 VITALS — BP 140/72 | HR 77 | Temp 99.1°F | Ht 69.0 in | Wt 189.0 lb

## 2021-02-25 DIAGNOSIS — R058 Other specified cough: Secondary | ICD-10-CM

## 2021-02-25 MED ORDER — BENZONATATE 100 MG PO CAPS: 100.0000 mg | ORAL_CAPSULE | Freq: Three times a day (TID) | ORAL | 0 refills | Status: DC | PRN

## 2021-02-25 MED ORDER — FLUTICASONE PROPIONATE HFA 110 MCG/ACT IN AERO: 2.0000 | INHALATION_SPRAY | Freq: Two times a day (BID) | RESPIRATORY_TRACT | 1 refills | Status: DC

## 2021-02-25 NOTE — Patient Instructions (Signed)
Continue to push fluids, practice good hand hygiene, and cover your mouth if you cough.  If you start having fevers, shaking or shortness of breath, seek immediate care.  Let us know if you need anything.  

## 2021-02-25 NOTE — Addendum Note (Signed)
Addended by: Sharon Seller B on: 02/25/2021 10:10 AM   Modules accepted: Orders

## 2021-02-25 NOTE — Progress Notes (Signed)
Chief Complaint  Patient presents with   Sinus Problem   Cough    Jeffrey Dodson here for URI complaints.  Duration: 9 days  Associated symptoms: sinus congestion, sinus pain, rhinorrhea, itchy watery eyes, sore throat, and coughing ; most of s/s's has turned into a dry cough Denies: ear pain, ear drainage, wheezing, shortness of breath, myalgia, and fevers, N/V/D, loss of taste/smell Treatment to date: INCS, Zyrtec, Tussin DM Sick contacts: Yes; spouse w similar s/s's prior to his s/s's Tested neg for covid.   Past Medical History:  Diagnosis Date   Degenerative joint disease    Diabetes mellitus type 2, noninsulin dependent (HCC)    GERD (gastroesophageal reflux disease)    Hiatal hernia    Hx of urethral stricture    Hypertriglyceridemia    Personal history of skin cancer     Objective BP 140/72   Pulse 77   Temp 99.1 F (37.3 C) (Oral)   Ht '5\' 9"'$  (1.753 m)   Wt 189 lb (85.7 kg)   SpO2 98%   BMI 27.91 kg/m  General: Awake, alert, appears stated age HEENT: AT, [place], ears patent b/l and TM's neg, nares patent w/o discharge, pharynx pink and without exudates, MMM, no sinus ttp Neck: No masses or asymmetry Heart: RRR Lungs: CTAB, no accessory muscle use Psych: Age appropriate judgment and insight, normal mood and affect  Post-viral cough syndrome - Plan: fluticasone (FLOVENT HFA) 110 MCG/ACT inhaler, benzonatate (TESSALON) 100 MG capsule  I think he is improving overall, has some lingering inflammation in his lungs. ICS for next 1-2 weeks. Rinse mouth out after use.  Continue to push fluids, practice good hand hygiene, cover mouth when coughing. F/u prn. If starting to experience fevers, shaking, or shortness of breath, seek immediate care. Pt voiced understanding and agreement to the plan.  [place], DO 02/25/21 10:03 AM

## 2021-03-24 ENCOUNTER — Other Ambulatory Visit: Payer: Self-pay | Admitting: Family Medicine

## 2021-04-01 ENCOUNTER — Encounter: Payer: Self-pay | Admitting: Family Medicine

## 2021-04-01 ENCOUNTER — Other Ambulatory Visit: Payer: Self-pay

## 2021-04-01 ENCOUNTER — Ambulatory Visit (INDEPENDENT_AMBULATORY_CARE_PROVIDER_SITE_OTHER): Payer: Medicare Other | Admitting: Family Medicine

## 2021-04-01 VITALS — BP 118/72 | HR 78 | Temp 98.0°F | Ht 70.0 in | Wt 190.2 lb

## 2021-04-01 DIAGNOSIS — E1165 Type 2 diabetes mellitus with hyperglycemia: Secondary | ICD-10-CM

## 2021-04-01 DIAGNOSIS — Z23 Encounter for immunization: Secondary | ICD-10-CM

## 2021-04-01 LAB — HEMOGLOBIN A1C: Hgb A1c MFr Bld: 6.5 % (ref 4.6–6.5)

## 2021-04-01 NOTE — Addendum Note (Signed)
Addended by: Sharon Seller B on: 04/01/2021 10:12 AM   Modules accepted: Orders

## 2021-04-01 NOTE — Patient Instructions (Addendum)
Give [place] 2-3 business days to get the results of your labs back. Our follow up will be dictated on your results. We may recommend a new monitor. We may change your medication also.   Keep the diet clean and stay active.  Let us know if you need anything.

## 2021-04-01 NOTE — Progress Notes (Signed)
Subjective:  CC: DM f/u  Jeffrey Dodson is a 75 y.o. male here for follow-up of diabetes.   [name]'s self monitored glucose range is up to 200's.  Patient denies hypoglycemic reactions. He checks his glucose levels 2 time(s) per week. Patient does not require insulin.   Medications include: Actos 30 mg/d Metformin gave him GI AE's.  Diet is healthy.  Exercise: active in yard No Cp or SOB.   Past Medical History:  Diagnosis Date   Degenerative joint disease    Diabetes mellitus type 2, noninsulin dependent (HCC)    GERD (gastroesophageal reflux disease)    Hiatal hernia    Hx of urethral stricture    Hypertriglyceridemia    Personal history of skin cancer     Related testing: Retinal exam: Done Pneumovax: Done  Objective:  BP 118/72   Pulse 78   Temp 98 F (36.7 C) (Oral)   Ht 5\' 10"  (1.778 m)   Wt 190 lb 4 oz (86.3 kg)   SpO2 95%   BMI 27.30 kg/m  General:  Well developed, well nourished, in no apparent distress Lungs:  CTAB, no access msc use Cardio:  RRR, no bruits, no LE edema Psych: Age appropriate judgment and insight  Assessment:   Type 2 diabetes mellitus with hyperglycemia, without long-term current use of insulin (HCC) - Plan: Hemoglobin A1c   Plan:   Chronic, uncontrolled. Ck A1c. Cont Actos 30 mg/d for now, will add [place] if no improvement vs glyburide pending cost. Counseled on diet and exercise. F/u in 3-6 mo pending above. The patient voiced understanding and agreement to the plan.  [place], DO 04/01/21 9:53 AM

## 2021-04-05 ENCOUNTER — Telehealth: Payer: Self-pay | Admitting: Family Medicine

## 2021-04-05 MED ORDER — PIOGLITAZONE HCL 30 MG PO TABS: 30.0000 mg | ORAL_TABLET | Freq: Every day | ORAL | 1 refills | Status: DC

## 2021-04-05 NOTE — Telephone Encounter (Signed)
Medication: pioglitazone (ACTOS) 30 MG tablet    Has the patient contacted their pharmacy? No. (If no, request that the patient contact the pharmacy for the refill.) (If yes, when and what did the pharmacy advise?)    Preferred Pharmacy (with phone number or street name):  [id number] - [place], [address]  [place], [place] [address]  Phone:  [phone]  Fax:  [phone]    Pt states that he is unsure if he should continue medication, if so pt requests 90 day supply instead of 30, since it will be cheaper w/ insurance.

## 2021-07-15 LAB — HM DIABETES EYE EXAM

## 2021-07-16 ENCOUNTER — Encounter: Payer: Self-pay | Admitting: Family Medicine

## 2021-08-06 ENCOUNTER — Telehealth: Payer: Self-pay | Admitting: Family Medicine

## 2021-08-06 ENCOUNTER — Encounter: Payer: Self-pay | Admitting: Family Medicine

## 2021-08-06 ENCOUNTER — Ambulatory Visit (INDEPENDENT_AMBULATORY_CARE_PROVIDER_SITE_OTHER): Payer: Medicare Other | Admitting: Family Medicine

## 2021-08-06 VITALS — BP 145/70 | HR 85 | Temp 97.7°F | Resp 16 | Ht 70.0 in | Wt 203.4 lb

## 2021-08-06 DIAGNOSIS — B029 Zoster without complications: Secondary | ICD-10-CM | POA: Diagnosis not present

## 2021-08-06 MED ORDER — VALACYCLOVIR HCL 1 G PO TABS: 1000.0000 mg | ORAL_TABLET | Freq: Three times a day (TID) | ORAL | 0 refills | Status: AC

## 2021-08-06 MED ORDER — PREDNISONE 20 MG PO TABS: 40.0000 mg | ORAL_TABLET | Freq: Every day | ORAL | 0 refills | Status: AC

## 2021-08-06 NOTE — Progress Notes (Signed)
Chief Complaint  Patient presents with   Shoulder Pain    Here for shoulder pain left side     Jeffrey Dodson is a 76 y.o. male here for a skin complaint.  Duration: 4 days Location: L shoulder Pruritic? No Painful? Yes- burning Drainage? No New soaps/lotions/topicals/detergents? No Sick contacts? No Other associated symptoms: saw some red sports yesterday Therapies tried thus far: none  Past Medical History:  Diagnosis Date   Degenerative joint disease    Diabetes mellitus type 2, noninsulin dependent (HCC)    GERD (gastroesophageal reflux disease)    Hiatal hernia    Hx of urethral stricture    Hypertriglyceridemia    Personal history of skin cancer     BP (!) 145/70 (BP Location: Right Arm, Patient Position: Sitting, Cuff Size: Small)    Pulse 85    Temp 97.7 F (36.5 C) (Oral)    Resp 16    Ht 5\' 10"  (1.778 m)    Wt 203 lb 6.4 oz (92.3 kg)    SpO2 96%    BMI 29.18 kg/m  Gen: awake, alert, appearing stated age Lungs: No accessory muscle use Skin: See below. No drainage,  fluctuance Psych: Age appropriate judgment and insight   L thoracic back region  Herpes zoster without complication - Plan: valACYclovir (VALTREX) 1000 MG tablet, predniSONE (DELTASONE) 20 MG tablet  Orders as above. Ice, Tylenol. Hopefully he doesn't have lingering neuralgia.  F/u prn. The patient voiced understanding and agreement to the plan.  [place], DO 08/06/21 3:04 PM

## 2021-08-06 NOTE — Patient Instructions (Signed)
Ice/cold pack over area for 10-15 min twice daily.  OK to take Tylenol 1000 mg (2 extra strength tabs) or 975 mg (3 regular strength tabs) every 6 hours as needed.  Let us know if you need anything.  

## 2021-08-06 NOTE — Telephone Encounter (Signed)
Error

## 2021-08-08 ENCOUNTER — Ambulatory Visit: Payer: Medicare Other | Admitting: Family Medicine

## 2021-09-27 ENCOUNTER — Other Ambulatory Visit: Payer: Self-pay | Admitting: Family Medicine

## 2021-09-30 ENCOUNTER — Encounter: Payer: Medicare Other | Admitting: Family Medicine

## 2021-10-01 ENCOUNTER — Encounter: Payer: Self-pay | Admitting: Family Medicine

## 2021-10-01 ENCOUNTER — Other Ambulatory Visit: Payer: Self-pay | Admitting: Family Medicine

## 2021-10-01 ENCOUNTER — Ambulatory Visit (INDEPENDENT_AMBULATORY_CARE_PROVIDER_SITE_OTHER): Payer: Medicare Other | Admitting: Family Medicine

## 2021-10-01 VITALS — BP 134/68 | HR 50 | Temp 98.1°F | Ht 70.0 in | Wt 205.0 lb

## 2021-10-01 DIAGNOSIS — E1169 Type 2 diabetes mellitus with other specified complication: Secondary | ICD-10-CM | POA: Diagnosis not present

## 2021-10-01 DIAGNOSIS — Z1211 Encounter for screening for malignant neoplasm of colon: Secondary | ICD-10-CM

## 2021-10-01 DIAGNOSIS — Z0000 Encounter for general adult medical examination without abnormal findings: Secondary | ICD-10-CM

## 2021-10-01 DIAGNOSIS — E669 Obesity, unspecified: Secondary | ICD-10-CM | POA: Diagnosis not present

## 2021-10-01 LAB — CBC
HCT: 41.1 % (ref 39.0–52.0)
Hemoglobin: 13.7 g/dL (ref 13.0–17.0)
MCHC: 33.4 g/dL (ref 30.0–36.0)
MCV: 92.5 fl (ref 78.0–100.0)
Platelets: 141 10*3/uL — ABNORMAL LOW (ref 150.0–400.0)
RBC: 4.44 Mil/uL (ref 4.22–5.81)
RDW: 14 % (ref 11.5–15.5)
WBC: 5.7 10*3/uL (ref 4.0–10.5)

## 2021-10-01 LAB — COMPREHENSIVE METABOLIC PANEL
ALT: 14 U/L (ref 0–53)
AST: 18 U/L (ref 0–37)
Albumin: 4.4 g/dL (ref 3.5–5.2)
Alkaline Phosphatase: 46 U/L (ref 39–117)
BUN: 20 mg/dL (ref 6–23)
CO2: 27 mEq/L (ref 19–32)
Calcium: 9.4 mg/dL (ref 8.4–10.5)
Chloride: 107 mEq/L (ref 96–112)
Creatinine, Ser: 1.31 mg/dL (ref 0.40–1.50)
GFR: 53.94 mL/min — ABNORMAL LOW (ref >60.00)
Glucose, Bld: 93 mg/dL (ref 70–99)
Potassium: 4.4 mEq/L (ref 3.5–5.1)
Sodium: 141 mEq/L (ref 135–145)
Total Bilirubin: 0.8 mg/dL (ref 0.2–1.2)
Total Protein: 6.7 g/dL (ref 6.0–8.3)

## 2021-10-01 LAB — LIPID PANEL
Cholesterol: 203 mg/dL — ABNORMAL HIGH (ref 0–200)
HDL: 46.1 mg/dL (ref >39.00)
LDL Cholesterol: 122 mg/dL — ABNORMAL HIGH (ref 0–99)
NonHDL: 157.15
Total CHOL/HDL Ratio: 4
Triglycerides: 178 mg/dL — ABNORMAL HIGH (ref 0.0–149.0)
VLDL: 35.6 mg/dL (ref 0.0–40.0)

## 2021-10-01 LAB — HEMOGLOBIN A1C: Hgb A1c MFr Bld: 6.4 % (ref 4.6–6.5)

## 2021-10-01 MED ORDER — ROSUVASTATIN CALCIUM 10 MG PO TABS: 10.0000 mg | ORAL_TABLET | Freq: Every day | ORAL | 3 refills | Status: DC

## 2021-10-01 NOTE — Progress Notes (Signed)
Chief Complaint  ?Patient presents with  ? Annual Exam  ? ? ?Well Male ?Jeffrey Dodson is here for a complete physical.   ?His last physical was >1 year ago.  ?Current diet: in general, a "healthy" diet.   ?Current exercise: none ?Weight trend: had increased a bit ?Fatigue out of ordinary? No. ?Seat belt? Yes.   ?Advanced directive? No ? ?Health maintenance ?Shingrix- No ?CCS- Due for CCS ?Tetanus- Due but has MDCR ?Hep C- Yes ?Pneumonia vaccine- Yes ? ?Past Medical History:  ?Diagnosis Date  ? Degenerative joint disease   ? Diabetes mellitus type 2, noninsulin dependent ([place])   ? GERD (gastroesophageal reflux disease)   ? Hiatal hernia   ? Hx of urethral stricture   ? Hypertriglyceridemia   ? Personal history of skin cancer   ?  ? ?Past Surgical History:  ?Procedure Laterality Date  ? S/P urethrotomy  11/05  ? Dr Rosana Hoes  ? ? ?Medications  ?Current Outpatient Medications on File Prior to Visit  ?Medication Sig Dispense Refill  ? Blood Glucose Monitoring Suppl (ONETOUCH VERIO) w/Device KIT Use three times per week to check blood sugar.  E11.9 1 kit 0  ? fluticasone (FLOVENT HFA) 110 MCG/ACT inhaler Inhale 2 puffs into the lungs in the morning and at bedtime. Rinse mouth out after use. 1 each 1  ? glucose blood (ONETOUCH VERIO) test strip Use three times a week to check blood sugar.  E11.9 100 each 3  ? KRILL OIL PO Take by mouth.    ? ONE TOUCH LANCETS MISC Use to check blood sugar 3 times per week.  E11.9 100 each 1  ? pioglitazone (ACTOS) 30 MG tablet TAKE 1 TABLET BY MOUTH EVERY DAY 90 tablet 1  ? ?Allergies ?No Known Allergies ? ?Family History ?Family History  ?Problem Relation Age of Onset  ? Deep vein thrombosis Father 19  ?     Deceased  ? Other Father   ?     PTE  ? Hypertension Mother   ? Congestive Heart Failure Other   ? Diabetes Mellitus II Other   ? Cirrhosis Brother   ?     Liver, deceased  ? Stroke Sister   ? Diabetes Sister   ? COPD Sister   ? ? ?Review of Systems: ?Constitutional:  no fevers ?Eye:   no recent significant change in vision ?Ears:  No changes in hearing ?Nose/Mouth/Throat:  no complaints of nasal congestion, no sore throat ?Cardiovascular: no chest pain ?Respiratory:  No shortness of breath ?Gastrointestinal:  No change in bowel habits ?GU:  No frequency ?Integumentary:  no abnormal skin lesions reported ?Neurologic:  no headaches ?Endocrine:  denies unexplained weight changes ? ?Exam ?BP 134/68   Pulse (!) 50   Temp 98.1 ?F (36.7 ?C) (Oral)   Ht _0  (1.778 m)   Wt 205 lb (93 kg)   SpO2 95%   BMI 29.41 kg/m?  ?General:  well developed, well nourished, in no apparent distress ?Skin:  no significant moles, warts, or growths ?Head:  no masses, lesions, or tenderness ?Eyes:  pupils equal and round, sclera anicteric without injection ?Ears:  canals without lesions, TMs shiny without retraction, no obvious effusion, no erythema ?Nose:  nares patent, septum midline, mucosa normal ?Throat/Pharynx:  lips and gingiva without lesion; tongue and uvula midline; non-inflamed pharynx; no exudates or postnasal drainage ?Lungs:  clear to auscultation, breath sounds equal bilaterally, no respiratory distress ?Cardio:  regular rhythm, bradycardic, no LE edema or bruits ?  Rectal: Deferred ?GI: BS+, S, NT, ND, no masses or organomegaly ?Musculoskeletal:  symmetrical muscle groups noted without atrophy or deformity ?Neuro:  gait normal; deep tendon reflexes normal and symmetric ?Psych: well oriented with normal range of affect and appropriate judgment/insight ? ?Assessment and Plan ? ?Well adult exam ? ?Diabetes mellitus type 2 in obese ([place]) - Plan: CBC, Comprehensive metabolic panel, Hemoglobin A1c, Lipid panel ? ?Screen for colon cancer - Plan: Ambulatory referral to Gastroenterology  ? ?Well 76 y.o. male. ?Counseled on diet and exercise. ?Advanced directive form provided today.  ?Other orders as above. ?CCS: Refer to GI, had issues with [facility]olog[facility].  ?Follow up in 6 mo pending above.  ?The patient voiced  understanding and agreement to the plan. ? ?Shelda Pal, DO ?10/01/21 ?8:14 AM ? ?

## 2021-10-01 NOTE — Patient Instructions (Addendum)
Give [place] 2-3 business days to get the results of your labs back.  ? ?Keep the diet clean and stay active. ? ?Aim to do some physical exertion for 150 minutes per week. This is typically divided into 5 days per week, 30 minutes per day. The activity should be enough to get your heart rate up. Anything is better than nothing if you have time constraints. ? ?Please get me a copy of your advanced directive form at your convenience.  ? ?The Shingrix vaccine (for shingles) is a 2 shot series spaced 2-6 months apart. It can make people feel low energy, achy and almost like they have the flu for 48 hours after injection. 1/5 people can have nausea and/or vomiting. Please plan accordingly when deciding on when to get this shot. Call your pharmacy for an appointment to get this. The second shot of the series is less severe regarding the side effects, but it still lasts 48 hours.  ? ?Let us know if you need anything. ?

## 2021-10-22 ENCOUNTER — Other Ambulatory Visit: Payer: Self-pay | Admitting: Family Medicine

## 2021-10-22 MED ORDER — FLUTICASONE PROPIONATE 50 MCG/ACT NA SUSP: 2.0000 | Freq: Every day | NASAL | 6 refills | Status: DC

## 2021-11-10 IMAGING — DX DG CHEST 2V
2 series · 2 of 2 positions shown · non-contrast
Comparison: 12/15/2013

CLINICAL DATA: Left-sided chest and arm pain beginning this
morning.

EXAM:
CHEST - 2 VIEW

[chest pa]
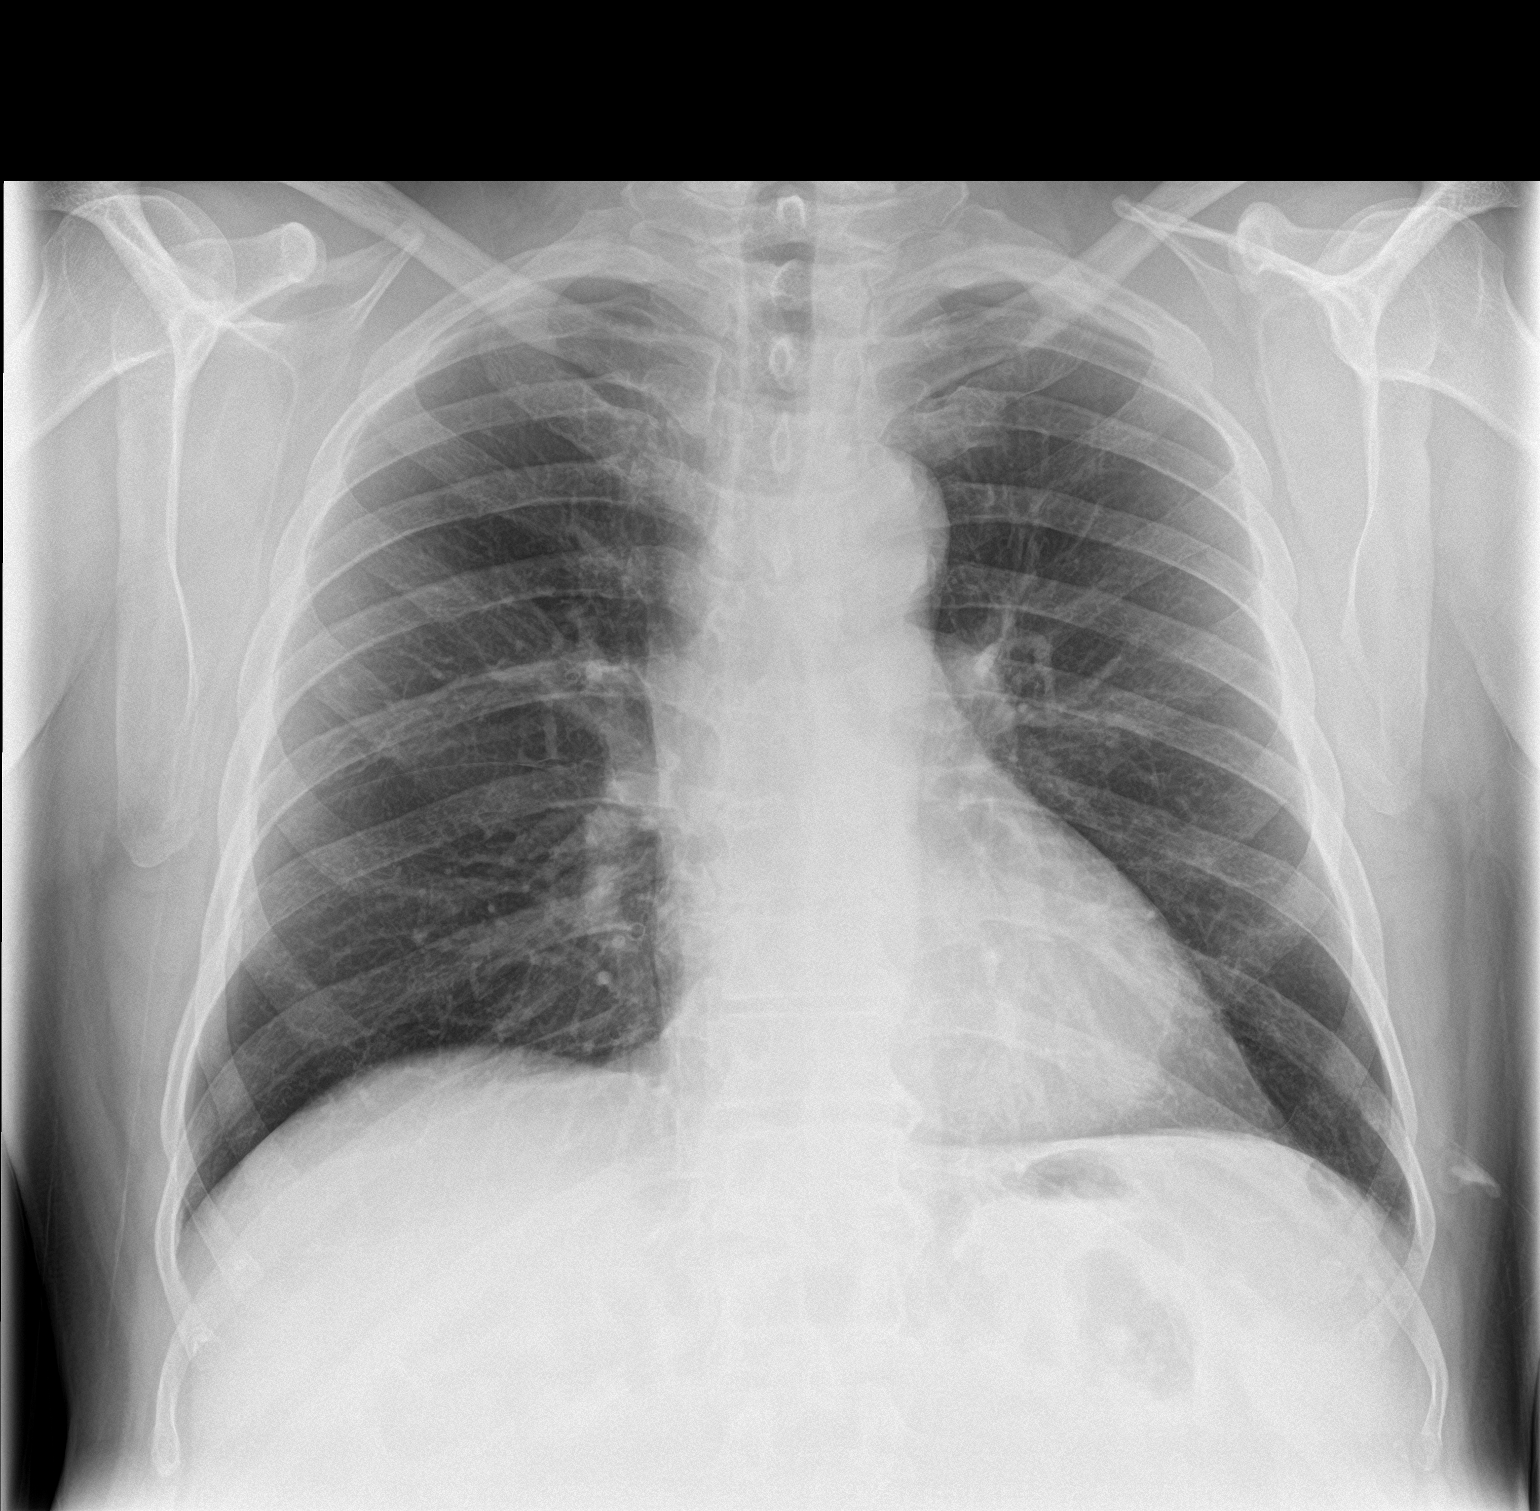

[chest lat]
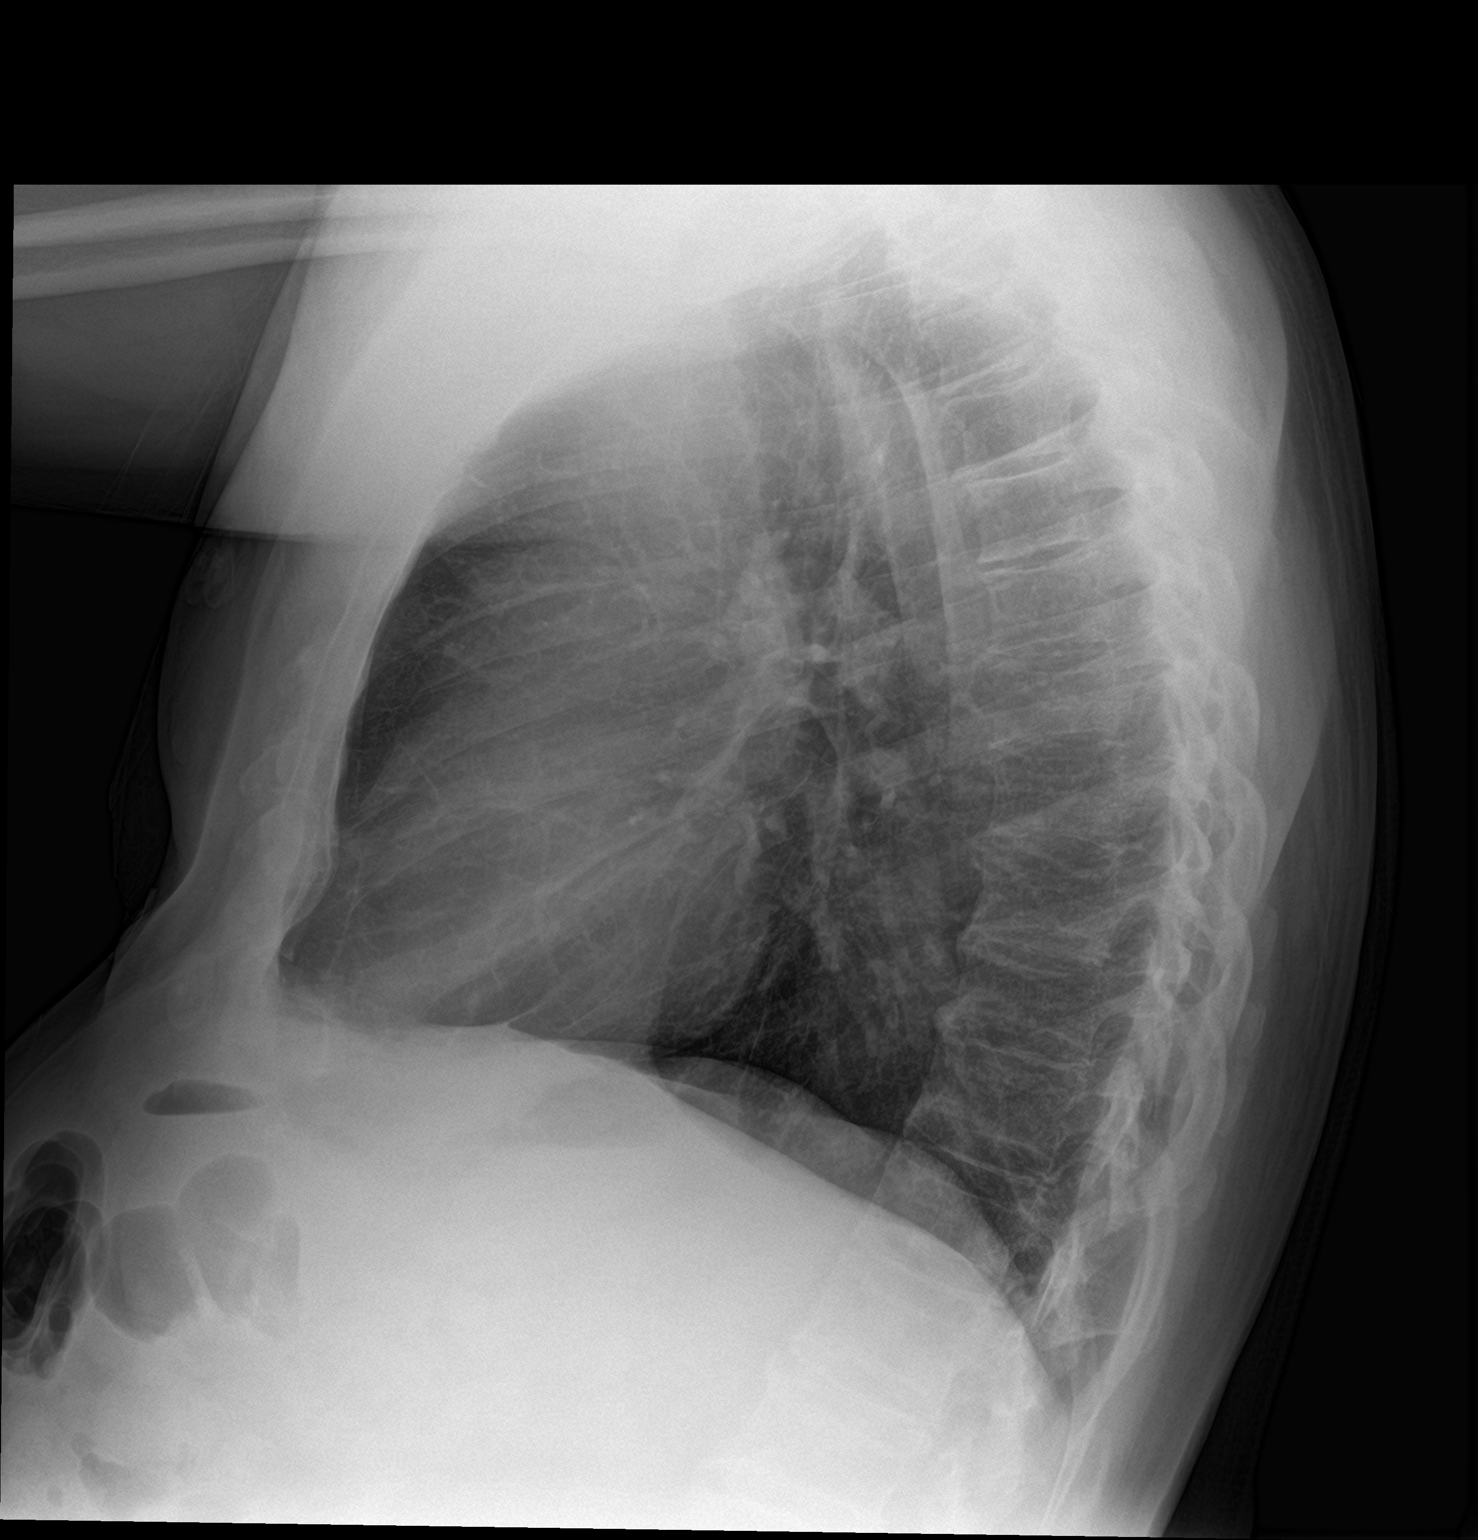

[2 of 2 positions shown; findings below may reference images not displayed]

FINDINGS: The heart size and mediastinal contours are within normal limits.
Both lungs are clear. The visualized skeletal structures are
unremarkable.
IMPRESSION: No active cardiopulmonary disease.

## 2021-12-04 ENCOUNTER — Telehealth: Payer: Self-pay

## 2021-12-04 NOTE — Telephone Encounter (Signed)
Spoke with Gwenlyn Perking and patient declined visit and states he see  Dr. [name] often and does not wish to schedule.

## 2021-12-12 DIAGNOSIS — D225 Melanocytic nevi of trunk: Secondary | ICD-10-CM | POA: Diagnosis not present

## 2021-12-12 DIAGNOSIS — L814 Other melanin hyperpigmentation: Secondary | ICD-10-CM | POA: Diagnosis not present

## 2021-12-12 DIAGNOSIS — Z85820 Personal history of malignant melanoma of skin: Secondary | ICD-10-CM | POA: Diagnosis not present

## 2021-12-12 DIAGNOSIS — L570 Actinic keratosis: Secondary | ICD-10-CM | POA: Diagnosis not present

## 2021-12-12 DIAGNOSIS — Z85828 Personal history of other malignant neoplasm of skin: Secondary | ICD-10-CM | POA: Diagnosis not present

## 2021-12-12 DIAGNOSIS — L821 Other seborrheic keratosis: Secondary | ICD-10-CM | POA: Diagnosis not present

## 2021-12-12 DIAGNOSIS — Z08 Encounter for follow-up examination after completed treatment for malignant neoplasm: Secondary | ICD-10-CM | POA: Diagnosis not present

## 2022-01-20 ENCOUNTER — Encounter: Payer: Self-pay | Admitting: Family Medicine

## 2022-01-20 ENCOUNTER — Ambulatory Visit (INDEPENDENT_AMBULATORY_CARE_PROVIDER_SITE_OTHER): Payer: Medicare Other | Admitting: Family Medicine

## 2022-01-20 VITALS — BP 139/70 | HR 70 | Temp 98.4°F | Ht 70.0 in | Wt 201.6 lb

## 2022-01-20 DIAGNOSIS — R059 Cough, unspecified: Secondary | ICD-10-CM | POA: Diagnosis not present

## 2022-01-20 MED ORDER — HYDROCODONE BIT-HOMATROP MBR 5-1.5 MG/5ML PO SOLN: 5.0000 mL | Freq: Four times a day (QID) | ORAL | 0 refills | Status: DC | PRN

## 2022-01-20 NOTE — Patient Instructions (Addendum)
Continue to push fluids, practice good hand hygiene, and cover your mouth if you cough.  If you start having fevers, shaking or shortness of breath, seek immediate care.  OK to take Tylenol 1000 mg (2 extra strength tabs) or 975 mg (3 regular strength tabs) every 6 hours as needed.  Do not drink alcohol, do any illicit/street drugs, drive or do anything that requires alertness while on this medicine.   Consider tea with honey.  Let us know if you need anything.

## 2022-01-20 NOTE — Progress Notes (Signed)
Dry cough x4 days, no other symptoms

## 2022-01-20 NOTE — Progress Notes (Signed)
CC: Cough  Jeffrey Dodson here for URI complaints.  Duration: 4 days  Associated symptoms:  chest wall pain, coughing Denies: sinus congestion, sinus pain, rhinorrhea, itchy watery eyes, ear pain, ear drainage, sore throat, wheezing, shortness of breath, myalgia, and fevers Treatment to date: cough drop Sick contacts: No  Past Medical History:  Diagnosis Date   Degenerative joint disease    Diabetes mellitus type 2, noninsulin dependent (HCC)    GERD (gastroesophageal reflux disease)    Hiatal hernia    Hx of urethral stricture    Hypertriglyceridemia    Personal history of skin cancer     Objective BP 139/70   Pulse 70   Temp 98.4 F (36.9 C)   Ht '5\' 10"'$  (1.778 m)   Wt 201 lb 9.6 oz (91.4 kg)   SpO2 97%   BMI 28.93 kg/m  General: Awake, alert, appears stated age HEENT: AT, [name], ears patent b/l and TM's neg, nares patent w/o discharge, pharynx pink and without exudates, MMM Neck: No masses or asymmetry Heart: RRR Lungs: CTAB, no accessory muscle use Psych: Age appropriate judgment and insight, normal mood and affect  Cough, unspecified type - Plan: HYDROcodone bit-homatropine (HYCODAN) 5-1.5 MG/5ML syrup  Did well with above syrup before. Will send in, send message in 2-3 days if no better. Warnings about syrup verbalized and written down.  Continue to push fluids, practice good hand hygiene, cover mouth when coughing. F/u prn. If starting to experience fevers, shaking, or shortness of breath, seek immediate care. Pt voiced understanding and agreement to the plan.  [place], DO 01/20/22 2:37 PM

## 2022-02-10 ENCOUNTER — Ambulatory Visit (INDEPENDENT_AMBULATORY_CARE_PROVIDER_SITE_OTHER): Payer: Medicare Other | Admitting: Family Medicine

## 2022-02-10 ENCOUNTER — Encounter: Payer: Self-pay | Admitting: Family Medicine

## 2022-02-10 VITALS — BP 136/82 | HR 86 | Temp 98.3°F | Ht 70.0 in | Wt 203.1 lb

## 2022-02-10 DIAGNOSIS — J189 Pneumonia, unspecified organism: Secondary | ICD-10-CM | POA: Diagnosis not present

## 2022-02-10 MED ORDER — AZITHROMYCIN 250 MG PO TABS: ORAL_TABLET | ORAL | 0 refills | Status: DC

## 2022-02-10 NOTE — Patient Instructions (Addendum)
Continue to push fluids, practice good hand hygiene, and cover your mouth if you cough.  If you start having fevers, shaking or shortness of breath, seek immediate care.  OK to take Tylenol 1000 mg (2 extra strength tabs) or 975 mg (3 regular strength tabs) every 6 hours as needed.  Ok to go back on [place].  Let us know if you need anything.

## 2022-02-10 NOTE — Progress Notes (Signed)
Chief Complaint  Patient presents with   Cough    Facial pressure Just not feeling well.    Jeffrey Dodson here for URI complaints.  Duration: 4 weeks  Associated symptoms: sinus headache, sinus pain, rhinorrhea, itchy watery eyes, sore throat, and productive coughing Denies: sinus congestion, ear pain, ear drainage, wheezing, shortness of breath, myalgia, and fevers Treatment to date: Hycodan, menthol cough syrup Sick contacts: No  Past Medical History:  Diagnosis Date   Degenerative joint disease    Diabetes mellitus type 2, noninsulin dependent (HCC)    GERD (gastroesophageal reflux disease)    Hiatal hernia    Hx of urethral stricture    Hypertriglyceridemia    Personal history of skin cancer     Objective BP 136/82   Pulse 86   Temp 98.3 F (36.8 C) (Oral)   Ht '5\' 10"'$  (1.778 m)   Wt 203 lb 2 oz (92.1 kg)   SpO2 97%   BMI 29.15 kg/m  General: Awake, alert, appears stated age HEENT: AT, [place], ears patent b/l and TM's neg, nares patent w/o discharge, pharynx pink and without exudates, MMM, no ttp over max/frontal sinuses b/l Neck: No masses or asymmetry Heart: RRR Lungs: CTAB, no accessory muscle use Psych: Age appropriate judgment and insight, normal mood and affect  Walking pneumonia - Plan: azithromycin (ZITHROMAX) 250 MG tablet  Continue to push fluids, practice good hand hygiene, cover mouth when coughing.  F/u prn. If starting to experience fevers, shaking, or shortness of breath, seek immediate care. If no improvement, will ck CXR and consider PFT's.  Pt voiced understanding and agreement to the plan.  [place], DO 02/10/22 3:11 PM

## 2022-03-24 ENCOUNTER — Encounter: Payer: Self-pay | Admitting: Family Medicine

## 2022-03-24 ENCOUNTER — Ambulatory Visit (INDEPENDENT_AMBULATORY_CARE_PROVIDER_SITE_OTHER): Payer: Medicare Other

## 2022-03-24 DIAGNOSIS — Z23 Encounter for immunization: Secondary | ICD-10-CM | POA: Diagnosis not present

## 2022-03-24 NOTE — Progress Notes (Addendum)
Pt came in for HD Flu vaccine and tolerated well. He received the injection in left arm.

## 2022-03-29 ENCOUNTER — Other Ambulatory Visit: Payer: Self-pay | Admitting: Family Medicine

## 2022-03-30 ENCOUNTER — Other Ambulatory Visit: Payer: Self-pay | Admitting: Family Medicine

## 2022-04-08 ENCOUNTER — Other Ambulatory Visit: Payer: Self-pay | Admitting: Family Medicine

## 2022-06-19 DIAGNOSIS — Z08 Encounter for follow-up examination after completed treatment for malignant neoplasm: Secondary | ICD-10-CM | POA: Diagnosis not present

## 2022-06-19 DIAGNOSIS — L570 Actinic keratosis: Secondary | ICD-10-CM | POA: Diagnosis not present

## 2022-06-19 DIAGNOSIS — Z85828 Personal history of other malignant neoplasm of skin: Secondary | ICD-10-CM | POA: Diagnosis not present

## 2022-06-19 DIAGNOSIS — Z85820 Personal history of malignant melanoma of skin: Secondary | ICD-10-CM | POA: Diagnosis not present

## 2022-06-19 DIAGNOSIS — L814 Other melanin hyperpigmentation: Secondary | ICD-10-CM | POA: Diagnosis not present

## 2022-06-19 DIAGNOSIS — D225 Melanocytic nevi of trunk: Secondary | ICD-10-CM | POA: Diagnosis not present

## 2022-06-19 DIAGNOSIS — L853 Xerosis cutis: Secondary | ICD-10-CM | POA: Diagnosis not present

## 2022-06-19 DIAGNOSIS — L821 Other seborrheic keratosis: Secondary | ICD-10-CM | POA: Diagnosis not present

## 2022-07-16 DIAGNOSIS — H31012 Macula scars of posterior pole (postinflammatory) (post-traumatic), left eye: Secondary | ICD-10-CM | POA: Diagnosis not present

## 2022-07-16 DIAGNOSIS — H0102A Squamous blepharitis right eye, upper and lower eyelids: Secondary | ICD-10-CM | POA: Diagnosis not present

## 2022-07-16 DIAGNOSIS — E119 Type 2 diabetes mellitus without complications: Secondary | ICD-10-CM | POA: Diagnosis not present

## 2022-07-16 DIAGNOSIS — H2513 Age-related nuclear cataract, bilateral: Secondary | ICD-10-CM | POA: Diagnosis not present

## 2022-07-16 LAB — HM DIABETES EYE EXAM

## 2022-08-30 DIAGNOSIS — J019 Acute sinusitis, unspecified: Secondary | ICD-10-CM | POA: Diagnosis not present

## 2022-08-30 DIAGNOSIS — R0981 Nasal congestion: Secondary | ICD-10-CM | POA: Diagnosis not present

## 2022-08-30 DIAGNOSIS — R051 Acute cough: Secondary | ICD-10-CM | POA: Diagnosis not present

## 2022-09-22 ENCOUNTER — Ambulatory Visit (INDEPENDENT_AMBULATORY_CARE_PROVIDER_SITE_OTHER): Payer: Medicare Other | Admitting: Family Medicine

## 2022-09-22 ENCOUNTER — Encounter: Payer: Self-pay | Admitting: Family Medicine

## 2022-09-22 VITALS — BP 128/76 | HR 59 | Temp 97.9°F | Ht 70.0 in | Wt 207.4 lb

## 2022-09-22 DIAGNOSIS — J302 Other seasonal allergic rhinitis: Secondary | ICD-10-CM

## 2022-09-22 DIAGNOSIS — H8113 Benign paroxysmal vertigo, bilateral: Secondary | ICD-10-CM | POA: Diagnosis not present

## 2022-09-22 MED ORDER — MONTELUKAST SODIUM 10 MG PO TABS: ORAL_TABLET | ORAL | 3 refills | Status: DC

## 2022-09-22 NOTE — Progress Notes (Signed)
Chief Complaint  Patient presents with   Dizziness   Allergies    Jeffrey Dodson is 77 y.o. pt here for dizziness.  Duration: 1 day Pass out? No Spinning? Yes Recent illness/fever? Yes- allergies recently flared Headache? No Neurologic signs? No Change in PO intake? No Palpitations? No  Hx of seasonal allergies, worse during pollen season. Takes INCS and Allegra daily as needed. Works pretty well, has prevented recurrence of sinus infections historically. Gets a coughing/drainage, runny/stuff nose, sinus pressure. No SOB, wheezing.   Past Medical History:  Diagnosis Date   Degenerative joint disease    Diabetes mellitus type 2, noninsulin dependent    GERD (gastroesophageal reflux disease)    Hiatal hernia    Hx of urethral stricture    Hypertriglyceridemia    Personal history of skin cancer     Family History  Problem Relation Age of Onset   Deep vein thrombosis Father 56       Deceased   Other Father        PTE   Hypertension Mother    Congestive Heart Failure Other    Diabetes Mellitus II Other    Cirrhosis Brother        Liver, deceased   Stroke Sister    Diabetes Sister    COPD Sister     Allergies as of 09/22/2022   No Known Allergies      Medication List        Accurate as of September 22, 2022 11:29 AM. If you have any questions, ask your nurse or doctor.          STOP taking these medications    azithromycin 250 MG tablet Commonly known as: ZITHROMAX Stopped by: Sharlene Dory, DO   HYDROcodone bit-homatropine 5-1.5 MG/5ML syrup Commonly known as: HYCODAN Stopped by: Sharlene Dory, DO       TAKE these medications    fluticasone 50 MCG/ACT nasal spray Commonly known as: FLONASE SPRAY 2 SPRAYS INTO EACH NOSTRIL EVERY DAY   KRILL OIL PO Take by mouth.   ONE TOUCH LANCETS Misc Use to check blood sugar 3 times per week.  E11.9   OneTouch Verio test strip Generic drug: glucose blood USE THREE TIMES A WEEK TO CHECK  BLOOD SUGAR. E11.9   OneTouch Verio w/Device Kit Use three times per week to check blood sugar.  E11.9   pioglitazone 30 MG tablet Commonly known as: ACTOS TAKE 1 TABLET BY MOUTH EVERY DAY   rosuvastatin 10 MG tablet Commonly known as: Crestor Take 1 tablet (10 mg total) by mouth daily.        BP 128/76 (BP Location: Left Arm, Cuff Size: Large)   Pulse (!) 59   Temp 97.9 F (36.6 C) (Oral)   Ht 5\' 10"  (1.778 m)   Wt 207 lb 6 oz (94.1 kg)   SpO2 97%   BMI 29.76 kg/m  General: Awake, alert, appears stated age Eyes: PERRLA, EOMi Nose: Patent, no rhinorrhea Ears: Patent, TM's neg b/l Heart: RRR, no murmurs, no carotid bruits Lungs: CTAB, no accessory muscle use MSK: 5/5 strength throughout, gait normal Neuro: No cerebellar signs, patellar reflex 1/4 b/l wo clonus, calcaneal reflex 0/4 b/l wo clonus, biceps reflex 1/4 b/l wo clonus; [place] + b/l, worse on L. Psych: Age appropriate judgment and insight, normal mood and affect  Seasonal allergies  Benign paroxysmal positional vertigo due to bilateral vestibular disorder  Chronic, uncontrolled. Cont INCS and Allegra. Add Singulair 10 mg daily  as needed. Epley maneuvers. If no improvement, will refer to vest rehab.  F/u in 1 mo for CPE. Pt voiced understanding and agreement to the plan.  Jilda Roche [place], DO 09/22/22 11:29 AM

## 2022-09-22 NOTE — Patient Instructions (Addendum)
Stay hydrated.  Continue the Flonase and Allegra. We will add a new daily as needed medication for your allergies.   If things are not better in a few days with the dizziness, let me know.   Go to the pharmacy to get your tetanus booster shot.   If the Epley maneuvers don't make sense, go to YouTube for instruction.  Let us know if you need anything.

## 2022-09-24 ENCOUNTER — Other Ambulatory Visit: Payer: Self-pay | Admitting: Family Medicine

## 2022-12-18 ENCOUNTER — Other Ambulatory Visit: Payer: Self-pay | Admitting: Family Medicine

## 2022-12-18 DIAGNOSIS — J302 Other seasonal allergic rhinitis: Secondary | ICD-10-CM

## 2023-03-10 ENCOUNTER — Ambulatory Visit (INDEPENDENT_AMBULATORY_CARE_PROVIDER_SITE_OTHER): Payer: Medicare Other

## 2023-03-10 DIAGNOSIS — Z23 Encounter for immunization: Secondary | ICD-10-CM

## 2023-03-24 ENCOUNTER — Other Ambulatory Visit: Payer: Self-pay | Admitting: Family Medicine

## 2023-04-14 ENCOUNTER — Encounter: Payer: Self-pay | Admitting: Family Medicine

## 2023-04-14 ENCOUNTER — Ambulatory Visit (INDEPENDENT_AMBULATORY_CARE_PROVIDER_SITE_OTHER): Payer: Medicare Other | Admitting: Family Medicine

## 2023-04-14 VITALS — BP 130/78 | HR 95 | Temp 98.0°F | Resp 16 | Ht 70.0 in | Wt 207.6 lb

## 2023-04-14 DIAGNOSIS — E781 Pure hyperglyceridemia: Secondary | ICD-10-CM | POA: Diagnosis not present

## 2023-04-14 DIAGNOSIS — E1169 Type 2 diabetes mellitus with other specified complication: Secondary | ICD-10-CM | POA: Diagnosis not present

## 2023-04-14 DIAGNOSIS — Z1211 Encounter for screening for malignant neoplasm of colon: Secondary | ICD-10-CM

## 2023-04-14 DIAGNOSIS — E669 Obesity, unspecified: Secondary | ICD-10-CM

## 2023-04-14 DIAGNOSIS — J0110 Acute frontal sinusitis, unspecified: Secondary | ICD-10-CM

## 2023-04-14 LAB — LIPID PANEL
Cholesterol: 130 mg/dL (ref 0–200)
HDL: 45.3 mg/dL (ref >39.00)
LDL Cholesterol: 38 mg/dL (ref 0–99)
NonHDL: 84.5
Total CHOL/HDL Ratio: 3
Triglycerides: 231 mg/dL — ABNORMAL HIGH (ref 0.0–149.0)
VLDL: 46.2 mg/dL — ABNORMAL HIGH (ref 0.0–40.0)

## 2023-04-14 LAB — COMPREHENSIVE METABOLIC PANEL
ALT: 13 U/L (ref 0–53)
AST: 16 U/L (ref 0–37)
Albumin: 4.5 g/dL (ref 3.5–5.2)
Alkaline Phosphatase: 61 U/L (ref 39–117)
BUN: 19 mg/dL (ref 6–23)
CO2: 25 meq/L (ref 19–32)
Calcium: 9.4 mg/dL (ref 8.4–10.5)
Chloride: 105 meq/L (ref 96–112)
Creatinine, Ser: 1.41 mg/dL (ref 0.40–1.50)
GFR: 48.86 mL/min — ABNORMAL LOW (ref >60.00)
Glucose, Bld: 174 mg/dL — ABNORMAL HIGH (ref 70–99)
Potassium: 4.3 meq/L (ref 3.5–5.1)
Sodium: 139 meq/L (ref 135–145)
Total Bilirubin: 0.6 mg/dL (ref 0.2–1.2)
Total Protein: 6.9 g/dL (ref 6.0–8.3)

## 2023-04-14 LAB — MICROALBUMIN / CREATININE URINE RATIO
Creatinine,U: 236.1 mg/dL
Microalb Creat Ratio: 2.3 mg/g (ref 0.0–30.0)
Microalb, Ur: 5.4 mg/dL — ABNORMAL HIGH (ref 0.0–1.9)

## 2023-04-14 LAB — HEMOGLOBIN A1C: Hgb A1c MFr Bld: 6.7 % — ABNORMAL HIGH (ref 4.6–6.5)

## 2023-04-14 MED ORDER — DOXYCYCLINE HYCLATE 100 MG PO TABS
100.0000 mg | ORAL_TABLET | Freq: Two times a day (BID) | ORAL | 0 refills | Status: AC
Start: 2023-04-16 — End: 2023-04-23

## 2023-04-14 MED ORDER — PREDNISONE 20 MG PO TABS
40.0000 mg | ORAL_TABLET | Freq: Every day | ORAL | 0 refills | Status: AC
Start: 2023-04-14 — End: 2023-04-19

## 2023-04-14 NOTE — Progress Notes (Signed)
Subjective:   Chief Complaint  Patient presents with   Sinus Problem    Sinus Problems    Jeffrey Dodson is a 77 y.o. male here for follow-up of diabetes.   Adalberto's self monitored glucose range is low 100's.  Patient denies hypoglycemic reactions. He checks his glucose levels 3 time(s) per week.  Patient does not require insulin.   Medications include: Actos 30 mg/d Diet is usually healthy.  Exercise: active with house projects  Hypertriglyceridemia Patient presents for hypertriglyceridemia follow up. Currently being treated with Crestor 10 mg/d and compliance with treatment thus far has been good. He denies myalgias. Diet/exercise as above.  No CP or SOB.  The patient is not known to have coexisting coronary artery disease.  URI Duration: 3 weeks  Associated symptoms: sinus congestion, sinus pain, rhinorrhea, and swimmy headed (happens when he gets URI's).  Denies: itchy watery eyes, ear pain, ear drainage, sore throat, wheezing, shortness of breath, myalgia, and fevers Treatment to date: Flonsae Sick contacts: No  Past Medical History:  Diagnosis Date   Degenerative joint disease    Diabetes mellitus type 2, noninsulin dependent (HCC)    GERD (gastroesophageal reflux disease)    Hiatal hernia    Hx of urethral stricture    Hypertriglyceridemia    Personal history of skin cancer      Related testing: Retinal exam: Done Pneumovax: done  Objective:  BP 130/78 (BP Location: Left Arm, Patient Position: Sitting, Cuff Size: Normal)   Pulse 95   Temp 98 F (36.7 C) (Oral)   Resp 16   Ht 5\' 10"  (1.778 m)   Wt 207 lb 9.6 oz (94.2 kg)   SpO2 96%   BMI 29.79 kg/m  General:  Well developed, well nourished, in no apparent distress Skin:  Warm, no pallor or diaphoresis Head:  Normocephalic, atraumatic Eyes:  Pupils equal and round, sclera anicteric without injection  Ears: Canals, patent, no otorrhea, TM's neg b/l Nose: Nares patent, no dc, frontal sinuses ttp  b/l Lungs:  CTAB, no access msc use Cardio:  RRR, no bruits, no LE edema Musculoskeletal:  Symmetrical muscle groups noted without atrophy or deformity Neuro:  Sensation intact to pinprick on feet Psych: Age appropriate judgment and insight  Assessment:   Type 2 diabetes mellitus with obesity (HCC) - Plan: Comprehensive metabolic panel, Lipid panel, Hemoglobin A1c, Microalbumin / creatinine urine ratio  Hypertriglyceridemia  Acute frontal sinusitis, recurrence not specified - Plan: predniSONE (DELTASONE) 20 MG tablet, doxycycline (VIBRA-TABS) 100 MG tablet  Screen for colon cancer - Plan: Ambulatory referral to Gastroenterology   Plan:   Chronic, stable. Cont Actos 30 mg/d. Counseled on diet and exercise. Chronic, stable. Cont Crestor 10 mg/d.  5 d pred burst 40 mg/d. Will take abx if no improvement.  Refer to GI. Shingrix and tetanus booster recommended to get at the pharmacy. F/u in 6 mo. The patient voiced understanding and agreement to the plan.  Jilda Roche [place], DO 04/14/23 11:53 AM

## 2023-04-14 NOTE — Patient Instructions (Addendum)
Give [place] 2-3 business days to get the results of your labs back.   Keep the diet clean and stay active.  Start with the prednisone for 2-3 days.   Continue to push fluids, practice good hand hygiene, and cover your mouth if you cough.  If you start having fevers, shaking or shortness of breath, seek immediate care.  OK to take Tylenol 1000 mg (2 extra strength tabs) or 975 mg (3 regular strength tabs) every 6 hours as needed.  Let us know if you need anything.

## 2023-04-15 ENCOUNTER — Other Ambulatory Visit: Payer: Self-pay | Admitting: Family Medicine

## 2023-04-15 MED ORDER — FENOFIBRATE 48 MG PO TABS: 48.0000 mg | ORAL_TABLET | Freq: Every day | ORAL | 3 refills | Status: DC

## 2023-05-25 ENCOUNTER — Other Ambulatory Visit: Payer: Self-pay | Admitting: Family Medicine

## 2023-05-25 ENCOUNTER — Encounter: Payer: Self-pay | Admitting: Family Medicine

## 2023-05-25 ENCOUNTER — Ambulatory Visit (INDEPENDENT_AMBULATORY_CARE_PROVIDER_SITE_OTHER): Payer: Medicare Other | Admitting: Family Medicine

## 2023-05-25 VITALS — BP 126/74 | HR 81 | Temp 98.0°F | Resp 16 | Ht 70.0 in | Wt 205.0 lb

## 2023-05-25 DIAGNOSIS — E782 Mixed hyperlipidemia: Secondary | ICD-10-CM

## 2023-05-25 LAB — HEPATIC FUNCTION PANEL
ALT: 11 U/L (ref 0–53)
AST: 16 U/L (ref 0–37)
Albumin: 4.9 g/dL (ref 3.5–5.2)
Alkaline Phosphatase: 52 U/L (ref 39–117)
Bilirubin, Direct: 0.2 mg/dL (ref 0.0–0.3)
Total Bilirubin: 0.8 mg/dL (ref 0.2–1.2)
Total Protein: 7.5 g/dL (ref 6.0–8.3)

## 2023-05-25 LAB — LIPID PANEL
Cholesterol: 163 mg/dL (ref 0–200)
HDL: 47.2 mg/dL (ref >39.00)
LDL Cholesterol: 72 mg/dL (ref 0–99)
NonHDL: 115.35
Total CHOL/HDL Ratio: 3
Triglycerides: 219 mg/dL — ABNORMAL HIGH (ref 0.0–149.0)
VLDL: 43.8 mg/dL — ABNORMAL HIGH (ref 0.0–40.0)

## 2023-05-25 MED ORDER — FENOFIBRATE 145 MG PO TABS: 145.0000 mg | ORAL_TABLET | Freq: Every day | ORAL | 3 refills | Status: DC

## 2023-05-25 NOTE — Patient Instructions (Addendum)
Give us 2-3 business days to get the results of your labs back. Our follow up will be based on your results.  ° °Keep the diet clean and stay active. ° °Let us know if you need anything. °

## 2023-05-25 NOTE — Progress Notes (Signed)
Chief Complaint  Patient presents with   Follow-up    Follow up    Subjective: Hyperlipidemia Patient presents for mixed hyperlipidemia follow up. Currently taking Tricor 48 mg/d, Crestor 10 mg/d and compliance with treatment thus far has been good. He denies myalgias. He is usually adhering to a healthy diet. Exercise: active at work The patient is not known to have coexisting coronary artery disease.  Past Medical History:  Diagnosis Date   Degenerative joint disease    Diabetes mellitus type 2, noninsulin dependent (HCC)    GERD (gastroesophageal reflux disease)    Hiatal hernia    Hx of urethral stricture    Hypertriglyceridemia    Personal history of skin cancer     Objective: BP 126/74 (BP Location: Left Arm, Patient Position: Sitting, Cuff Size: Normal)   Pulse 81   Temp 98 F (36.7 C) (Oral)   Resp 16   Ht 5\' 10"  (1.778 m)   Wt 205 lb (93 kg)   SpO2 98%   BMI 29.41 kg/m  General: Awake, appears stated age Heart: RRR, no LE edema, no bruits Lungs: CTAB, no rales, wheezes or rhonchi. No accessory muscle use Psych: Age appropriate judgment and insight, normal affect and mood  Assessment and Plan: Mixed hyperlipidemia - Plan: Lipid panel, Hepatic function panel  Chronic, unstable. Cont Crestor 10 mg/d, Tricor 48 mg/d. If not controlled still, will consider increase the former med.  F/u pending above. The patient voiced understanding and agreement to the plan.  Jilda Roche [place], DO 05/25/23  11:10 AM

## 2023-05-26 ENCOUNTER — Other Ambulatory Visit: Payer: Self-pay

## 2023-05-26 DIAGNOSIS — E782 Mixed hyperlipidemia: Secondary | ICD-10-CM

## 2023-06-30 DIAGNOSIS — Z85820 Personal history of malignant melanoma of skin: Secondary | ICD-10-CM | POA: Diagnosis not present

## 2023-06-30 DIAGNOSIS — L814 Other melanin hyperpigmentation: Secondary | ICD-10-CM | POA: Diagnosis not present

## 2023-06-30 DIAGNOSIS — Z85828 Personal history of other malignant neoplasm of skin: Secondary | ICD-10-CM | POA: Diagnosis not present

## 2023-06-30 DIAGNOSIS — D225 Melanocytic nevi of trunk: Secondary | ICD-10-CM | POA: Diagnosis not present

## 2023-06-30 DIAGNOSIS — L821 Other seborrheic keratosis: Secondary | ICD-10-CM | POA: Diagnosis not present

## 2023-06-30 DIAGNOSIS — C44629 Squamous cell carcinoma of skin of left upper limb, including shoulder: Secondary | ICD-10-CM | POA: Diagnosis not present

## 2023-06-30 DIAGNOSIS — D492 Neoplasm of unspecified behavior of bone, soft tissue, and skin: Secondary | ICD-10-CM | POA: Diagnosis not present

## 2023-06-30 DIAGNOSIS — L570 Actinic keratosis: Secondary | ICD-10-CM | POA: Diagnosis not present

## 2023-06-30 DIAGNOSIS — Z08 Encounter for follow-up examination after completed treatment for malignant neoplasm: Secondary | ICD-10-CM | POA: Diagnosis not present

## 2023-07-06 ENCOUNTER — Other Ambulatory Visit (INDEPENDENT_AMBULATORY_CARE_PROVIDER_SITE_OTHER): Payer: Medicare Other

## 2023-07-06 DIAGNOSIS — E782 Mixed hyperlipidemia: Secondary | ICD-10-CM

## 2023-07-06 LAB — HEPATIC FUNCTION PANEL
ALT: 17 U/L (ref 0–53)
AST: 20 U/L (ref 0–37)
Albumin: 4.6 g/dL (ref 3.5–5.2)
Alkaline Phosphatase: 35 U/L — ABNORMAL LOW (ref 39–117)
Bilirubin, Direct: 0.1 mg/dL (ref 0.0–0.3)
Total Bilirubin: 0.6 mg/dL (ref 0.2–1.2)
Total Protein: 6.9 g/dL (ref 6.0–8.3)

## 2023-07-06 LAB — LIPID PANEL
Cholesterol: 129 mg/dL (ref 0–200)
HDL: 57.1 mg/dL (ref >39.00)
LDL Cholesterol: 48 mg/dL (ref 0–99)
NonHDL: 71.73
Total CHOL/HDL Ratio: 2
Triglycerides: 120 mg/dL (ref 0.0–149.0)
VLDL: 24 mg/dL (ref 0.0–40.0)

## 2023-07-07 ENCOUNTER — Other Ambulatory Visit: Payer: Self-pay

## 2023-07-13 DIAGNOSIS — C44629 Squamous cell carcinoma of skin of left upper limb, including shoulder: Secondary | ICD-10-CM | POA: Diagnosis not present

## 2023-08-07 DIAGNOSIS — H31012 Macula scars of posterior pole (postinflammatory) (post-traumatic), left eye: Secondary | ICD-10-CM | POA: Diagnosis not present

## 2023-08-07 DIAGNOSIS — H2513 Age-related nuclear cataract, bilateral: Secondary | ICD-10-CM | POA: Diagnosis not present

## 2023-08-07 DIAGNOSIS — E119 Type 2 diabetes mellitus without complications: Secondary | ICD-10-CM | POA: Diagnosis not present

## 2023-08-07 LAB — HM DIABETES EYE EXAM

## 2023-08-10 ENCOUNTER — Encounter: Payer: Self-pay | Admitting: Ophthalmology

## 2023-09-13 ENCOUNTER — Other Ambulatory Visit: Payer: Self-pay | Admitting: Family Medicine

## 2023-11-20 ENCOUNTER — Telehealth: Payer: Self-pay

## 2023-11-20 NOTE — Telephone Encounter (Signed)
 Spoke w/ Pt- due for 6 month f/u, appt scheduled.

## 2023-11-25 ENCOUNTER — Encounter: Payer: Self-pay | Admitting: Family Medicine

## 2023-11-25 ENCOUNTER — Ambulatory Visit (INDEPENDENT_AMBULATORY_CARE_PROVIDER_SITE_OTHER): Admitting: Family Medicine

## 2023-11-25 VITALS — BP 116/74 | HR 82 | Temp 98.0°F | Resp 16 | Ht 70.0 in | Wt 205.0 lb

## 2023-11-25 DIAGNOSIS — M545 Low back pain, unspecified: Secondary | ICD-10-CM

## 2023-11-25 MED ORDER — METHOCARBAMOL 500 MG PO TABS: 500.0000 mg | ORAL_TABLET | Freq: Three times a day (TID) | ORAL | 0 refills | Status: DC | PRN

## 2023-11-25 MED ORDER — PREDNISONE 20 MG PO TABS: 40.0000 mg | ORAL_TABLET | Freq: Every day | ORAL | 0 refills | Status: AC

## 2023-11-25 NOTE — Patient Instructions (Addendum)

## 2023-11-25 NOTE — Progress Notes (Signed)
 Musculoskeletal Exam  Patient: Jeffrey Dodson DOB: 1945/07/08  DOS: 11/25/2023  SUBJECTIVE:  Chief Complaint:   Chief Complaint  Patient presents with   Back Pain    Back Pain    Jeffrey Dodson is a 78 y.o.  male for evaluation and treatment of back pain.   Onset:  4 days ago.  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  Location: lower L Character:  aching, constant Progression of issue:  is slightly better Associated symptoms: movements flare his s/s's; radiating into his hip and groin Denies bowel/bladder incontinence, bruising, redness, swelling, or weakness Treatment: to date has been heat and ibuprofen.   Neurovascular symptoms: no  Past Medical History:  Diagnosis Date   Degenerative joint disease    Diabetes mellitus type 2, noninsulin dependent (HCC)    GERD (gastroesophageal reflux disease)    Hiatal hernia    Hx of urethral stricture    Hypertriglyceridemia    Personal history of skin cancer     Objective:  VITAL SIGNS: BP 116/74 (BP Location: Left Arm, Patient Position: Sitting)   Pulse 82   Temp 98 F (36.7 C) (Oral)   Resp 16   Ht 5' 10 (1.778 m)   Wt 205 lb (93 kg)   SpO2 96%   BMI 29.41 kg/m  Constitutional: Well formed, well developed. No acute distress. HENT: Normocephalic, atraumatic.  Thorax & Lungs:  No accessory muscle use Musculoskeletal: low back.   Tenderness to palpation: no Deformity: no Ecchymosis: no Straight leg test: negative for Poor hamstring flexibility b/l. There is pain with L hip flexion.  Neurologic: Normal sensory function. No focal deficits noted. DTR's equal and symmetric in LE's. No clonus. Gait is antalgic.  Psychiatric: Normal mood. Age appropriate judgment and insight. Alert & oriented x 3.    Assessment:  Acute left-sided low back pain without sciatica - Plan: methocarbamol (ROBAXIN) 500 MG tablet, predniSONE  (DELTASONE ) 20 MG tablet  Plan: Stretches/exercises, heat, ice, Tylenol,  5 d pred burst 40 mg/d. Robaxin prn. Warned about potential drowsiness.  F/u prn. The patient voiced understanding and agreement to the plan.   Shellie Dials [place], DO 11/25/23  11:26 AM

## 2023-12-08 ENCOUNTER — Encounter: Payer: Self-pay | Admitting: Family Medicine

## 2023-12-08 ENCOUNTER — Ambulatory Visit: Payer: Self-pay | Admitting: Family Medicine

## 2023-12-08 ENCOUNTER — Ambulatory Visit (INDEPENDENT_AMBULATORY_CARE_PROVIDER_SITE_OTHER): Admitting: Family Medicine

## 2023-12-08 VITALS — BP 128/78 | HR 68 | Temp 97.8°F | Resp 16 | Ht 70.0 in | Wt 210.0 lb

## 2023-12-08 DIAGNOSIS — E782 Mixed hyperlipidemia: Secondary | ICD-10-CM

## 2023-12-08 DIAGNOSIS — Z7984 Long term (current) use of oral hypoglycemic drugs: Secondary | ICD-10-CM

## 2023-12-08 DIAGNOSIS — E1169 Type 2 diabetes mellitus with other specified complication: Secondary | ICD-10-CM | POA: Diagnosis not present

## 2023-12-08 DIAGNOSIS — E669 Obesity, unspecified: Secondary | ICD-10-CM

## 2023-12-08 LAB — LIPID PANEL
Cholesterol: 137 mg/dL (ref 0–200)
HDL: 46.6 mg/dL (ref >39.00)
LDL Cholesterol: 52 mg/dL (ref 0–99)
NonHDL: 90.26
Total CHOL/HDL Ratio: 3
Triglycerides: 189 mg/dL — ABNORMAL HIGH (ref 0.0–149.0)
VLDL: 37.8 mg/dL (ref 0.0–40.0)

## 2023-12-08 LAB — COMPREHENSIVE METABOLIC PANEL WITH GFR
ALT: 14 U/L (ref 0–53)
AST: 14 U/L (ref 0–37)
Albumin: 4.3 g/dL (ref 3.5–5.2)
Alkaline Phosphatase: 56 U/L (ref 39–117)
BUN: 15 mg/dL (ref 6–23)
CO2: 31 meq/L (ref 19–32)
Calcium: 9.3 mg/dL (ref 8.4–10.5)
Chloride: 105 meq/L (ref 96–112)
Creatinine, Ser: 1.23 mg/dL (ref 0.40–1.50)
GFR: 57.29 mL/min — ABNORMAL LOW (ref >60.00)
Glucose, Bld: 96 mg/dL (ref 70–99)
Potassium: 4.8 meq/L (ref 3.5–5.1)
Sodium: 141 meq/L (ref 135–145)
Total Bilirubin: 0.6 mg/dL (ref 0.2–1.2)
Total Protein: 6.5 g/dL (ref 6.0–8.3)

## 2023-12-08 LAB — HEMOGLOBIN A1C: Hgb A1c MFr Bld: 6.7 % — ABNORMAL HIGH (ref 4.6–6.5)

## 2023-12-08 MED ORDER — ROSUVASTATIN CALCIUM 20 MG PO TABS
20.0000 mg | ORAL_TABLET | Freq: Every day | ORAL | 3 refills | Status: AC
Start: 2023-12-08 — End: ?

## 2023-12-08 NOTE — Progress Notes (Signed)
 Subjective:   Chief Complaint  Patient presents with   Follow-up    Follow Up    Jeffrey Dodson is a 78 y.o. male here for follow-up of diabetes.   Jeffrey Dodson does not routinely monitor his sugars.  Patient does not require insulin.   Medications include: Actos  30 mg/d Diet is OK.  Exercise: Active with work  Hyperlipidemia Patient presents for dyslipidemia follow up. Currently being treated with Crestor  10 mg/d and compliance with treatment thus far has been good. He denies myalgias. Diet/exercise as above. No CP or SOB.  The patient is not known to have coexisting coronary artery disease.  Past Medical History:  Diagnosis Date   Degenerative joint disease    Diabetes mellitus type 2, noninsulin dependent (HCC)    GERD (gastroesophageal reflux disease)    Hiatal hernia    Hx of urethral stricture    Hypertriglyceridemia    Personal history of skin cancer      Related testing: Retinal exam: Done Pneumovax: done  Objective:  BP 128/78 (BP Location: Left Arm, Patient Position: Sitting)   Pulse 68   Temp 97.8 F (36.6 C) (Oral)   Resp 16   Ht 5' 10 (1.778 m)   Wt 210 lb (95.3 kg)   SpO2 96%   BMI 30.13 kg/m  General:  Well developed, well nourished, in no apparent distress Lungs:  CTAB, no access msc use Cardio:  RRR, no bruits, no LE edema Psych: Age appropriate judgment and insight  Assessment:   Type 2 diabetes mellitus with obesity (HCC) - Plan: Comprehensive metabolic panel with GFR, Hemoglobin A1c, Lipid panel  Mixed hyperlipidemia   Plan:   Chronic, stable. Counseled on diet and exercise. Cont Actos  30 mg/d.  Chronic, stable. Cont Crestor  10 mg/d.  Tetanus booster rec'd.  F/u in 6 mo. The patient voiced understanding and agreement to the plan.  Mabel [place], DO 12/08/23 10:11 AM

## 2023-12-08 NOTE — Patient Instructions (Addendum)
 Give us  2-3 business days to get the results of your labs back.   Keep the diet clean and stay active.  Consider getting a tetanus booster at the pharmacy.   Let us  know if you need anything.

## 2023-12-09 ENCOUNTER — Other Ambulatory Visit: Payer: Self-pay

## 2023-12-09 DIAGNOSIS — E782 Mixed hyperlipidemia: Secondary | ICD-10-CM

## 2023-12-10 ENCOUNTER — Telehealth: Payer: Self-pay

## 2023-12-10 NOTE — Telephone Encounter (Signed)
 Copied from CRM [id number]. Topic: General - Other >> Dec 10, 2023 10:50 AM Rosina BIRCH wrote: Reason for CRM: patient called stating the doctor increased his rosuvastatin  to 20mg  and he want to make sure that the doctor want him to stop taking the 10mg  and start taking the 20mg  CB [phone]

## 2023-12-11 NOTE — Telephone Encounter (Signed)
 Called pt Jeffrey Dodson letting him know only take rosuvastatin  to 20 mg, should stop the 10 mg.

## 2023-12-28 DIAGNOSIS — Z85828 Personal history of other malignant neoplasm of skin: Secondary | ICD-10-CM | POA: Diagnosis not present

## 2023-12-28 DIAGNOSIS — Z08 Encounter for follow-up examination after completed treatment for malignant neoplasm: Secondary | ICD-10-CM | POA: Diagnosis not present

## 2023-12-28 DIAGNOSIS — L570 Actinic keratosis: Secondary | ICD-10-CM | POA: Diagnosis not present

## 2024-01-19 ENCOUNTER — Ambulatory Visit: Payer: Self-pay | Admitting: Family Medicine

## 2024-01-19 ENCOUNTER — Other Ambulatory Visit (INDEPENDENT_AMBULATORY_CARE_PROVIDER_SITE_OTHER)

## 2024-01-19 DIAGNOSIS — E782 Mixed hyperlipidemia: Secondary | ICD-10-CM | POA: Diagnosis not present

## 2024-01-19 LAB — LIPID PANEL
Cholesterol: 117 mg/dL (ref 0–200)
HDL: 46.6 mg/dL (ref >39.00)
LDL Cholesterol: 48 mg/dL (ref 0–99)
NonHDL: 70.56
Total CHOL/HDL Ratio: 3
Triglycerides: 111 mg/dL (ref 0.0–149.0)
VLDL: 22.2 mg/dL (ref 0.0–40.0)

## 2024-01-19 LAB — HEPATIC FUNCTION PANEL
ALT: 13 U/L (ref 0–53)
AST: 16 U/L (ref 0–37)
Albumin: 4.2 g/dL (ref 3.5–5.2)
Alkaline Phosphatase: 52 U/L (ref 39–117)
Bilirubin, Direct: 0.1 mg/dL (ref 0.0–0.3)
Total Bilirubin: 0.6 mg/dL (ref 0.2–1.2)
Total Protein: 6.4 g/dL (ref 6.0–8.3)

## 2024-03-10 ENCOUNTER — Ambulatory Visit (INDEPENDENT_AMBULATORY_CARE_PROVIDER_SITE_OTHER)

## 2024-03-10 DIAGNOSIS — Z23 Encounter for immunization: Secondary | ICD-10-CM | POA: Diagnosis not present

## 2024-03-24 DIAGNOSIS — C44629 Squamous cell carcinoma of skin of left upper limb, including shoulder: Secondary | ICD-10-CM | POA: Diagnosis not present

## 2024-03-29 NOTE — Progress Notes (Signed)
 Jeffrey Dodson                                          MRN: [id number]   03/29/2024   The [facility] Quality Team Specialist reviewed this patient medical record for the purposes of chart review for care gap closure. The following were reviewed: chart review for care gap closure-kidney health evaluation for diabetes:eGFR  and uACR. Need uacr   [facility] Quality Team

## 2024-04-08 ENCOUNTER — Other Ambulatory Visit: Payer: Self-pay | Admitting: Family Medicine

## 2024-06-28 ENCOUNTER — Telehealth: Payer: Self-pay

## 2024-06-28 NOTE — Telephone Encounter (Signed)
 Copied from CRM [id number]. Topic: Referral - Question >> Jun 28, 2024 12:01 PM Deleta RAMAN wrote: Reason for CRM: patient needs two referrals I notified the patient he needs to the provider before getting the referrals patient declined. [phone]  Pt declined appt.

## 2024-06-29 ENCOUNTER — Telehealth: Payer: Self-pay

## 2024-06-29 DIAGNOSIS — L989 Disorder of the skin and subcutaneous tissue, unspecified: Secondary | ICD-10-CM

## 2024-06-29 DIAGNOSIS — Q159 Congenital malformation of eye, unspecified: Secondary | ICD-10-CM

## 2024-06-29 NOTE — Telephone Encounter (Signed)
 Copied from CRM [id number]. Topic: Referral - Question >> Jun 29, 2024 12:36 PM Laymon HERO wrote: Reason for CRM: [facility] and [facility] Dr [place] Grey-# [phone] ......[facility] -Needing referrals sent to both- He is a current patient at both office  Both referral has been sent.

## 2024-06-29 NOTE — Telephone Encounter (Signed)
 Copied from CRM [id number]. Topic: Referral - Question >> Jun 29, 2024 12:36 PM Laymon HERO wrote: Reason for CRM: [facility] and [facility] Dr [place] Grey-# [phone] ......[facility] -Needing referrals sent to both- He is a current patient at both office   Called pt spoke with wife and advised.

## 2024-06-29 NOTE — Telephone Encounter (Signed)
 Copied from CRM [id number]. Topic: Referral - Question >> Jun 29, 2024 12:36 PM Laymon HERO wrote: Reason for CRM: [facility] and [facility] Dr [place] Grey-# [phone] ......[facility] -Needing referrals sent to both- He is a current patient at both office
